# Patient Record
Sex: Male | Born: 1980 | Race: Black or African American | Hispanic: No | Marital: Married | Smoking: Never smoker
Health system: Southern US, Community
[De-identification: ages and names within clinical notes are randomized; demographics above are authoritative.]

## PROBLEM LIST (undated history)

## (undated) DIAGNOSIS — I10 Essential (primary) hypertension: Secondary | ICD-10-CM

## (undated) HISTORY — PX: SINUS SURGERY WITH INSTATRAK: SHX5215

## (undated) HISTORY — PX: TONSILLECTOMY: SUR1361

---

## 2017-01-09 ENCOUNTER — Emergency Department (HOSPITAL_COMMUNITY): Payer: 59

## 2017-01-09 ENCOUNTER — Other Ambulatory Visit: Payer: Self-pay

## 2017-01-09 ENCOUNTER — Encounter (HOSPITAL_BASED_OUTPATIENT_CLINIC_OR_DEPARTMENT_OTHER): Payer: Self-pay | Admitting: *Deleted

## 2017-01-09 ENCOUNTER — Inpatient Hospital Stay (HOSPITAL_BASED_OUTPATIENT_CLINIC_OR_DEPARTMENT_OTHER)
Admission: EM | Admit: 2017-01-09 | Discharge: 2017-01-13 | DRG: 395 | Disposition: A | Discharge status: Home / Self Care | Payer: 59 | Attending: Internal Medicine | Admitting: Internal Medicine

## 2017-01-09 DIAGNOSIS — X58XXXA Exposure to other specified factors, initial encounter: Secondary | ICD-10-CM | POA: Diagnosis present

## 2017-01-09 DIAGNOSIS — I10 Essential (primary) hypertension: Secondary | ICD-10-CM | POA: Diagnosis present

## 2017-01-09 DIAGNOSIS — T183XXA Foreign body in small intestine, initial encounter: Principal | ICD-10-CM | POA: Diagnosis present

## 2017-01-09 DIAGNOSIS — T184XXA Foreign body in colon, initial encounter: Secondary | ICD-10-CM | POA: Diagnosis not present

## 2017-01-09 DIAGNOSIS — Z79899 Other long term (current) drug therapy: Secondary | ICD-10-CM

## 2017-01-09 DIAGNOSIS — T447X5A Adverse effect of beta-adrenoreceptor antagonists, initial encounter: Secondary | ICD-10-CM | POA: Diagnosis present

## 2017-01-09 DIAGNOSIS — E876 Hypokalemia: Secondary | ICD-10-CM | POA: Diagnosis present

## 2017-01-09 DIAGNOSIS — R1031 Right lower quadrant pain: Secondary | ICD-10-CM | POA: Diagnosis present

## 2017-01-09 DIAGNOSIS — D509 Iron deficiency anemia, unspecified: Secondary | ICD-10-CM | POA: Diagnosis present

## 2017-01-09 DIAGNOSIS — R001 Bradycardia, unspecified: Secondary | ICD-10-CM | POA: Diagnosis present

## 2017-01-09 DIAGNOSIS — R1084 Generalized abdominal pain: Secondary | ICD-10-CM

## 2017-01-09 DIAGNOSIS — E739 Lactose intolerance, unspecified: Secondary | ICD-10-CM | POA: Diagnosis present

## 2017-01-09 HISTORY — DX: Essential (primary) hypertension: I10

## 2017-01-09 LAB — LIPASE, BLOOD: Lipase: 29 U/L (ref 11–51)

## 2017-01-09 LAB — CBC WITH DIFFERENTIAL/PLATELET
Basophils Absolute: 0 10*3/uL (ref 0.0–0.1)
Basophils Relative: 0 %
Eosinophils Absolute: 0.2 10*3/uL (ref 0.0–0.7)
Eosinophils Relative: 2 %
HCT: 38.1 % — ABNORMAL LOW (ref 39.0–52.0)
Hemoglobin: 12.4 g/dL — ABNORMAL LOW (ref 13.0–17.0)
Lymphocytes Relative: 28 %
Lymphs Abs: 2.3 10*3/uL (ref 0.7–4.0)
MCH: 21.6 pg — ABNORMAL LOW (ref 26.0–34.0)
MCHC: 32.5 g/dL (ref 30.0–36.0)
MCV: 66.3 fL — ABNORMAL LOW (ref 78.0–100.0)
Monocytes Absolute: 0.9 10*3/uL (ref 0.1–1.0)
Monocytes Relative: 11 %
Neutro Abs: 4.8 10*3/uL (ref 1.7–7.7)
Neutrophils Relative %: 59 %
Platelets: 187 10*3/uL (ref 150–400)
RBC: 5.75 MIL/uL (ref 4.22–5.81)
RDW: 14.8 % (ref 11.5–15.5)
WBC: 8.2 10*3/uL (ref 4.0–10.5)

## 2017-01-09 LAB — URINALYSIS, MICROSCOPIC (REFLEX)

## 2017-01-09 LAB — COMPREHENSIVE METABOLIC PANEL
ALT: 20 U/L (ref 17–63)
AST: 19 U/L (ref 15–41)
Albumin: 4.2 g/dL (ref 3.5–5.0)
Alkaline Phosphatase: 88 U/L (ref 38–126)
Anion gap: 9 (ref 5–15)
BUN: 14 mg/dL (ref 6–20)
CO2: 29 mmol/L (ref 22–32)
Calcium: 9.1 mg/dL (ref 8.9–10.3)
Chloride: 102 mmol/L (ref 101–111)
Creatinine, Ser: 1.08 mg/dL (ref 0.61–1.24)
GFR calc Af Amer: >60 mL/min (ref >60)
GFR calc non Af Amer: >60 mL/min (ref >60)
Glucose, Bld: 91 mg/dL (ref 65–99)
Potassium: 3.4 mmol/L — ABNORMAL LOW (ref 3.5–5.1)
Sodium: 140 mmol/L (ref 135–145)
Total Bilirubin: 1.3 mg/dL — ABNORMAL HIGH (ref 0.3–1.2)
Total Protein: 7.4 g/dL (ref 6.5–8.1)

## 2017-01-09 LAB — URINALYSIS, ROUTINE W REFLEX MICROSCOPIC
Bilirubin Urine: NEGATIVE
Glucose, UA: NEGATIVE mg/dL
Hgb urine dipstick: NEGATIVE
Ketones, ur: 15 mg/dL — AB
Leukocytes, UA: NEGATIVE
Nitrite: NEGATIVE
Protein, ur: 30 mg/dL — AB
Specific Gravity, Urine: 1.01 (ref 1.005–1.030)
pH: 6.5 (ref 5.0–8.0)

## 2017-01-09 MED ORDER — PANTOPRAZOLE SODIUM 40 MG PO TBEC
40.0000 mg | DELAYED_RELEASE_TABLET | Freq: Every day | ORAL | Status: DC
  Administered 2017-01-09 – 2017-01-13 (×5): 40 mg via ORAL
  Filled 2017-01-09 (×5): qty 1

## 2017-01-09 MED ORDER — MORPHINE SULFATE (PF) 2 MG/ML IV SOLN
2.0000 mg | INTRAVENOUS | Status: DC | PRN
Start: 2017-01-09 — End: 2017-01-09
  Administered 2017-01-09: 2 mg via INTRAVENOUS

## 2017-01-09 MED ORDER — MORPHINE SULFATE (PF) 2 MG/ML IV SOLN
INTRAVENOUS | Status: AC
  Administered 2017-01-09: 2 mg via INTRAVENOUS
  Filled 2017-01-09: qty 1

## 2017-01-09 MED ORDER — LABETALOL HCL 5 MG/ML IV SOLN: 5.0000 mg | INTRAVENOUS | Status: DC | PRN

## 2017-01-09 MED ORDER — ACETAMINOPHEN 325 MG PO TABS
650.0000 mg | ORAL_TABLET | Freq: Four times a day (QID) | ORAL | Status: DC | PRN
  Administered 2017-01-09: 650 mg via ORAL
  Filled 2017-01-09: qty 2

## 2017-01-09 MED ORDER — ONDANSETRON HCL 4 MG/2ML IJ SOLN
4.0000 mg | Freq: Four times a day (QID) | INTRAMUSCULAR | Status: DC | PRN
  Administered 2017-01-10: 4 mg via INTRAVENOUS
  Filled 2017-01-09: qty 2

## 2017-01-09 MED ORDER — POTASSIUM CHLORIDE CRYS ER 20 MEQ PO TBCR
20.0000 meq | EXTENDED_RELEASE_TABLET | Freq: Once | ORAL | Status: AC
  Administered 2017-01-09: 20 meq via ORAL
  Filled 2017-01-09: qty 1

## 2017-01-09 MED ORDER — POTASSIUM CHLORIDE CRYS ER 20 MEQ PO TBCR: 20.0000 meq | EXTENDED_RELEASE_TABLET | Freq: Every day | ORAL | Status: DC

## 2017-01-09 MED ORDER — IOPAMIDOL (ISOVUE-300) INJECTION 61%
INTRAVENOUS | Status: AC
  Administered 2017-01-09: 100 mL via INTRAVENOUS
  Filled 2017-01-09: qty 100

## 2017-01-09 MED ORDER — METOPROLOL TARTRATE 25 MG PO TABS
100.0000 mg | ORAL_TABLET | Freq: Two times a day (BID) | ORAL | Status: DC
  Administered 2017-01-09 – 2017-01-11 (×4): 100 mg via ORAL
  Filled 2017-01-09 (×6): qty 4

## 2017-01-09 MED ORDER — SODIUM CHLORIDE 0.9 % IV SOLN
INTRAVENOUS | Status: DC
  Administered 2017-01-09 – 2017-01-13 (×5): via INTRAVENOUS

## 2017-01-09 MED ORDER — AMLODIPINE BESYLATE 5 MG PO TABS
10.0000 mg | ORAL_TABLET | Freq: Every day | ORAL | Status: DC
  Administered 2017-01-09 – 2017-01-13 (×5): 10 mg via ORAL
  Filled 2017-01-09 (×5): qty 2

## 2017-01-09 MED ORDER — ONDANSETRON HCL 4 MG PO TABS: 4.0000 mg | ORAL_TABLET | Freq: Four times a day (QID) | ORAL | Status: DC | PRN

## 2017-01-09 MED ORDER — MORPHINE SULFATE (PF) 4 MG/ML IV SOLN
2.0000 mg | INTRAVENOUS | Status: DC | PRN
  Administered 2017-01-10: 4 mg via INTRAVENOUS
  Administered 2017-01-10: 2 mg via INTRAVENOUS
  Administered 2017-01-10: 4 mg via INTRAVENOUS
  Administered 2017-01-10: 2 mg via INTRAVENOUS
  Administered 2017-01-11 (×3): 4 mg via INTRAVENOUS
  Filled 2017-01-09 (×7): qty 1

## 2017-01-09 MED ORDER — ACETAMINOPHEN 650 MG RE SUPP: 650.0000 mg | Freq: Four times a day (QID) | RECTAL | Status: DC | PRN

## 2017-01-09 NOTE — ED Notes (Signed)
Bed: [id number] Expected date:  Expected time:  Means of arrival:  Comments: Dail

## 2017-01-09 NOTE — ED Provider Notes (Signed)
[facility] EMERGENCY DEPARTMENT Provider Note   CSN: [id number] Arrival date & time: 01/09/17  1242     History   Chief Complaint Chief Complaint  Patient presents with  . Abdominal Pain    HPI Joel Perkins is a 36 y.o. male.  HPI   Pain for 2 days across abdomen, epigastric down to the right lower side.  Is constant pain. Trying to move or squat down or turn it is worse. Son had similar symptoms 3 days ago, aws given miralax, but he has still had pain.  Reports he is not constipated, tried miralax, milk which did not help pain.  Has had 4 BM. No nausea or vomiting. Had fever last night thinks, was sweating and felt hot.  Lower appetite.  Past Medical History:  Diagnosis Date  . Hypertension     There are no active problems to display for this patient.   Past Surgical History:  Procedure Laterality Date  . SINUS SURGERY WITH INSTATRAK    . TONSILLECTOMY         Home Medications    Prior to Admission medications   Medication Sig Start Date End Date Taking? Authorizing Provider  AMLODIPINE BESYLATE PO Take by mouth.   Yes [provider]  LISINOPRIL PO Take by mouth.   Yes [provider]  METOPROLOL SUCCINATE PO Take by mouth.   Yes [provider]  Potassium (POTASSIMIN PO) Take by mouth.   Yes [provider]    Family History No family history on file.  Social History Social History   Tobacco Use  . Smoking status: Never Smoker  . Smokeless tobacco: Never Used  Substance Use Topics  . Alcohol use: Yes    Frequency: Never  . Drug use: No     Allergies   Patient has no known allergies.   Review of Systems Review of Systems  Constitutional: Positive for appetite change and fever (subjective).  HENT: Negative for sore throat.   Eyes: Negative for visual disturbance.  Respiratory: Negative for shortness of breath.   Cardiovascular: Negative for chest pain.  Gastrointestinal: Positive for  abdominal pain. Negative for constipation, diarrhea, nausea and vomiting.  Genitourinary: Negative for difficulty urinating and dysuria (no dysuria but does reoprt pain in abdomen with urination).  Musculoskeletal: Negative for back pain and neck stiffness.  Skin: Negative for rash.  Neurological: Negative for syncope and headaches.     Physical Exam Updated Vital Signs BP (!) 160/111 (BP Location: Right Arm)   Pulse 70   Temp 98.9 F (37.2 C) (Oral)   Resp 18   Ht 5\' 8"  (1.727 m)   Wt 90.7 kg (200 lb)   SpO2 100%   BMI 30.41 kg/m   Physical Exam  Constitutional: He is oriented to person, place, and time. He appears well-developed and well-nourished. No distress.  HENT:  Head: Normocephalic and atraumatic.  Eyes: Conjunctivae and EOM are normal.  Neck: Normal range of motion.  Cardiovascular: Normal rate, regular rhythm, normal heart sounds and intact distal pulses. Exam reveals no gallop and no friction rub.  No murmur heard. Pulmonary/Chest: Effort normal and breath sounds normal. No respiratory distress. He has no wheezes. He has no rales.  Abdominal: Soft. He exhibits no distension. There is tenderness in the right lower quadrant. There is tenderness at McBurney's point. There is no guarding.  Musculoskeletal: He exhibits no edema.  Neurological: He is alert and oriented to person, place, and time.  Skin: Skin is  warm and dry. He is not diaphoretic.  Nursing note and vitals reviewed.    ED Treatments / Results  Labs (all labs ordered are listed, but only abnormal results are displayed) Labs Reviewed  URINALYSIS, ROUTINE W REFLEX MICROSCOPIC - Abnormal; Notable for the following components:      Result Value   Ketones, ur 15 (*)    Protein, ur 30 (*)    All other components within normal limits  URINALYSIS, MICROSCOPIC (REFLEX) - Abnormal; Notable for the following components:   Bacteria, UA FEW (*)    Squamous Epithelial / LPF 0-5 (*)    All other components  within normal limits  COMPREHENSIVE METABOLIC PANEL - Abnormal; Notable for the following components:   Potassium 3.4 (*)    Total Bilirubin 1.3 (*)    All other components within normal limits  CBC WITH DIFFERENTIAL/PLATELET - Abnormal; Notable for the following components:   Hemoglobin 12.4 (*)    HCT 38.1 (*)    MCV 66.3 (*)    MCH 21.6 (*)    All other components within normal limits  LIPASE, BLOOD    EKG  EKG Interpretation None       Radiology No results found.  Procedures Procedures (including critical care time)  Medications Ordered in ED Medications - No data to display   Initial Impression / Assessment and Plan / ED Course  I have reviewed the triage vital signs and the nursing notes.  Pertinent labs & imaging results that were available during my care of the patient were reviewed by me and considered in my medical decision making (see chart for details).    36yo male with htn presents with concern for RLQ abdominal pain.  History, exam, labs not consistent with SBO, cholecystitis or UTI.  Concern for possible appendicitis given RLQ tenderness, decreased appetite.  [facility] Discussed with Dr. Denton LankSteinl. [facility]pelvis CT.   If CT negative, consider possible muscular strain, however at this time given combination of symptoms and exam requires CT for appendicitis r/o.     Final Clinical Impressions(s) / ED Diagnoses   Final diagnoses:  Right lower quadrant pain    ED Discharge Orders    None       Alvira MondaySchlossman, Tonesha Tsou, MD 01/09/17 [phone]

## 2017-01-09 NOTE — ED Notes (Signed)
Please call Lelon MastSamantha for report at 2115. Number [phone]

## 2017-01-09 NOTE — H&P (Signed)
History and Physical    Joel LenzObinna Chapa Perkins DOB: 03/19/1980 DOA: 01/09/2017  PCP: Patient, No Pcp Per  Patient coming from: Home  I have personally briefly [facility]  Chief Complaint: abd pain  HPI: Joel Perkins is a 36 y.o. male with medical history significant of HTN.  Patient presents to ED with c/o 2 day history of RLQ abd pain.  Radiates up his R side.  Worse with movement.  Not constipated.  Has had 4 BMs.  No n/v.  Thinks he had fevers last night.   ED Course: CT reveals that patient has a foreign body, likely a small bone in terminal ileum. (patient admits he eats chicken bones and fish bones routinely but never had these get stuck before).   Review of Systems: As per HPI otherwise 10 point review of systems negative.   Past Medical History:  Diagnosis Date  . Hypertension     Past Surgical History:  Procedure Laterality Date  . SINUS SURGERY WITH INSTATRAK    . TONSILLECTOMY       reports that  has never smoked. he has never used smokeless tobacco. He reports that he drinks alcohol. He reports that he does not use drugs.  Allergies  Allergen Reactions  . Lactose Nausea And Vomiting    No family history on file. Son had abdominal pain 3 days ago.  Prior to Admission medications   Medication Sig Start Date End Date Taking? Authorizing Provider  amLODipine (NORVASC) 10 MG tablet Take 10 mg by mouth daily. 11/14/16  Yes [provider]  lisinopril-hydrochlorothiazide (PRINZIDE,ZESTORETIC) 20-12.5 MG tablet Take 1 tablet by mouth 2 (two) times daily. 11/14/16  Yes [provider]  metoprolol tartrate (LOPRESSOR) 100 MG tablet Take 100 mg by mouth 2 (two) times daily. 11/14/16  Yes [provider]  pantoprazole (PROTONIX) 40 MG tablet Take 40 mg by mouth daily. 10/22/16  Yes [provider]  potassium chloride SA (K-DUR,KLOR-CON) 20 MEQ tablet Take 20 mEq by mouth daily. 11/14/16   Yes [provider]    Physical Exam: Vitals:   01/09/17 1254 01/09/17 1258 01/09/17 1529  BP:  (!) 164/105 (!) 160/111  Pulse:  67 70  Resp:  18 18  Temp:  98.9 F (37.2 C)   TempSrc:  Oral   SpO2:  98% 100%  Weight: 90.7 kg (200 lb)    Height: 5\' 8"  (1.727 m)      Constitutional: NAD, calm, comfortable Eyes: PERRL, lids and conjunctivae normal ENMT: Mucous membranes are moist. Posterior pharynx clear of any exudate or lesions.Normal dentition.  Neck: normal, supple, no masses, no thyromegaly Respiratory: clear to auscultation bilaterally, no wheezing, no crackles. Normal respiratory effort. No accessory muscle use.  Cardiovascular: Regular rate and rhythm, no murmurs / rubs / gallops. No extremity edema. 2+ pedal pulses. No carotid bruits.  Abdomen: RLQ tenderness Musculoskeletal: no clubbing / cyanosis. No joint deformity upper and lower extremities. Good ROM, no contractures. Normal muscle tone.  Skin: no rashes, lesions, ulcers. No induration Neurologic: CN 2-12 grossly intact. Sensation intact, DTR normal. Strength 5/5 in all 4.  Psychiatric: Normal judgment and insight. Alert and oriented x 3. Normal mood.    Labs on Admission: I have personally reviewed following labs and imaging studies  CBC: Recent Labs  Lab 01/09/17 1507  WBC 8.2  NEUTROABS 4.8  HGB 12.4*  HCT 38.1*  MCV 66.3*  PLT 187   Basic Metabolic Panel: Recent  Labs  Lab 01/09/17 1507  NA 140  K 3.4*  CL 102  CO2 29  GLUCOSE 91  BUN 14  CREATININE 1.08  CALCIUM 9.1   GFR: Estimated Creatinine Clearance: 103.4 mL/min (by C-G formula based on SCr of 1.08 mg/dL). Liver Function Tests: Recent Labs  Lab 01/09/17 1507  AST 19  ALT 20  ALKPHOS 88  BILITOT 1.3*  PROT 7.4  ALBUMIN 4.2   Recent Labs  Lab 01/09/17 1507  LIPASE 29   No results for input(s): AMMONIA in the last 168 hours. Coagulation Profile: No results for input(s): INR, PROTIME in the last 168 hours. Cardiac  Enzymes: No results for input(s): CKTOTAL, CKMB, CKMBINDEX, TROPONINI in the last 168 hours. BNP (last 3 results) No results for input(s): PROBNP in the last 8760 hours. HbA1C: No results for input(s): HGBA1C in the last 72 hours. CBG: No results for input(s): GLUCAP in the last 168 hours. Lipid Profile: No results for input(s): CHOL, HDL, LDLCALC, TRIG, CHOLHDL, LDLDIRECT in the last 72 hours. Thyroid Function Tests: No results for input(s): TSH, T4TOTAL, FREET4, T3FREE, THYROIDAB in the last 72 hours. Anemia Panel: No results for input(s): VITAMINB12, FOLATE, FERRITIN, TIBC, IRON, RETICCTPCT in the last 72 hours. Urine analysis:    Component Value Date/Time   COLORURINE YELLOW 01/09/2017 1300   APPEARANCEUR CLEAR 01/09/2017 1300   LABSPEC 1.010 01/09/2017 1300   PHURINE 6.5 01/09/2017 1300   GLUCOSEU NEGATIVE 01/09/2017 1300   HGBUR NEGATIVE 01/09/2017 1300   BILIRUBINUR NEGATIVE 01/09/2017 1300   KETONESUR 15 (A) 01/09/2017 1300   PROTEINUR 30 (A) 01/09/2017 1300   NITRITE NEGATIVE 01/09/2017 1300   LEUKOCYTESUR NEGATIVE 01/09/2017 1300    Radiological Exams on Admission: Ct Abdomen Pelvis W Contrast  Result Date: 01/09/2017 CLINICAL DATA:  Right lower quadrant abdominal pain for 2 days. EXAM: CT ABDOMEN AND PELVIS WITH CONTRAST TECHNIQUE: Multidetector CT imaging of the abdomen and pelvis was performed using the standard protocol following bolus administration of intravenous contrast. CONTRAST:  100mL ISOVUE-300 IOPAMIDOL (ISOVUE-300) INJECTION 61% COMPARISON:  None. FINDINGS: Lower chest: The lung bases are clear of acute process. No pleural effusion or pulmonary lesions. The heart is normal in size. No pericardial effusion. The distal esophagus and aorta are unremarkable. Hepatobiliary: No focal hepatic lesions or intrahepatic biliary dilatation. The gallbladder is normal. No common bile duct dilatation. Pancreas: No mass, inflammation or ductal dilatation. Spleen: Normal  size.  No focal lesions. Adrenals/Urinary Tract: No renal lesions or hydronephrosis. The bladder appears normal. Stomach/Bowel: The stomach, duodenum, small bowel and colon are unremarkable except for the distal ileum which contains a foreign body the which is most likely a small bone possibly a chicken or fishbone. It measures approximately 23 mm. There is mucosal enhancement, submucosal edema and mild serosal enhancement. I do not see any evidence for perforation but I suspect the foreign body may be projecting through the mucosa and into the wall of the ileum and could be stuck there. I would estimate that this foreign body is approximately 7 cm from the terminal ileum and it may be retrievable via colonoscopy. No findings to suggest a small bowel obstruction. The colon is unremarkable. The appendix is normal. Vascular/Lymphatic: The aorta is normal in caliber. No dissection. The branch vessels are patent. The major venous structures are patent. No mesenteric or retroperitoneal mass or adenopathy. Small scattered lymph nodes are noted. Reproductive: The prostate gland and seminal vesicles are unremarkable. Other: No pelvic mass or adenopathy. No free pelvic  fluid collections. No inguinal mass or adenopathy. Moderate size periumbilical abdominal wall hernia containing fat. Musculoskeletal: No significant bony findings. IMPRESSION: 1. 23 mm thin foreign body in the distal ileum is most likely a small bone of some type. Moderate inflammation of the bowel in this area and I would be worried that the foreign body is stuck, possibly piercing the mucosa. No findings for perforation or small bowel obstruction. It may be accessible via colonoscopy. 2. Normal appendix. 3. Periumbilical abdominal wall hernia containing fat. Electronically Signed   By: Rudie Meyer M.D.   On: 01/09/2017 18:52    EKG: Independently reviewed.  Assessment/Plan Principal Problem:   Foreign body in small intestine Active Problems:   HTN  (hypertension)    1. Foreign body in small intestine - terminal ileum 1. Dr. Bosie Clos called by EDP 1. Plan to see tomorrow 2. Do bowel prep starting tomorrow 3. Colonoscopy if foreign body still present on Sunday and doesn't come out with bowel prep. 2. No evidence of perf, if develops, then call gen surg. 3. No SIRS, monitor for evidence of SIRS, if develops then start ABx. 4. Discussed, recommendation not to eat bones in future. 5. Will keep NPO except for sips with meds (assuming that this much is OKAY since GI is planning on bowel prep tomorrow) 6. Will put on NS at 100 cc/hr to prevent dehydration 2. HTN - 1. Continue amlodipine 2. Continue metoprolol 3. Holding lisinopril-HCTZ for now 3. Hypokalemia - K of 3.4 1. Replace 2. BMP in AM  DVT prophylaxis: SCDs Code Status: Full Family Communication: Family at bedside Disposition Plan: Home after admit Consults called: Dr. Bosie Clos Admission status: Admit to inpatient   Hillary Bow. DO [facility] Pager [phone]  If 7AM-7PM, please contact day team taking care of patient [email or web address] Password [facility]  01/09/2017, 9:03 PM

## 2017-01-09 NOTE — ED Triage Notes (Signed)
Abdominal pain x 2 days. Sharp generalized pain. Headache. Chills.

## 2017-01-09 NOTE — ED Notes (Addendum)
[facility]  Dr. Denton LankSteinl accepting.  Report called to CN -Matt.  IV secured for transfer

## 2017-01-09 NOTE — ED Provider Notes (Signed)
36 year old male presents today with abdominal pain.  Patient was seen at a satellite facility where their CT scanner was down, patient was transferred here for CT evaluation of abdominal pain.  Please see previous providers note for full H&P.  Patient with 3 days of abdominal pain from the epigastric region down the right lower side.  Patient notes that the pain is persistent, worse with movement.  He reports no nausea or vomiting, reports feeling hot last night with no objective fever.  Patient reports he has not had anything to eat or drink since last night.  Reports normal bowel movements.  CT scan performed here shows 23 mm thin foreign body in the distal ileum with moderate surrounding inflammation of the bowel.  Spoke with Dr. Bosie ClosSchooler on the phone who recommended patient be admitted to the hospitalist service, he will evaluate the patient tomorrow for bowel prep and potential intervention the following day if this does not pass.  Patient is well-appearing at this time, hospitalist service consulted for admission who agreed to admit.  Vitals:   01/09/17 1529 01/09/17 2134  BP: (!) 160/111 (!) 174/101  Pulse: 70 66  Resp: 18 18  Temp:  98 F (36.7 C)  SpO2: 100% 98%       Eyvonne MechanicHedges, Jamilah Jean, PA-C 01/09/17 2324    Rolan BuccoBelfi, Melanie, MD 01/09/17 2342

## 2017-01-10 LAB — BASIC METABOLIC PANEL
Anion gap: 5 (ref 5–15)
BUN: 13 mg/dL (ref 6–20)
CO2: 29 mmol/L (ref 22–32)
Calcium: 8.8 mg/dL — ABNORMAL LOW (ref 8.9–10.3)
Chloride: 105 mmol/L (ref 101–111)
Creatinine, Ser: 0.91 mg/dL (ref 0.61–1.24)
GFR calc Af Amer: >60 mL/min (ref >60)
GFR calc non Af Amer: >60 mL/min (ref >60)
Glucose, Bld: 87 mg/dL (ref 65–99)
Potassium: 3.1 mmol/L — ABNORMAL LOW (ref 3.5–5.1)
Sodium: 139 mmol/L (ref 135–145)

## 2017-01-10 LAB — CBC
HCT: 38.3 % — ABNORMAL LOW (ref 39.0–52.0)
Hemoglobin: 12.3 g/dL — ABNORMAL LOW (ref 13.0–17.0)
MCH: 21.9 pg — ABNORMAL LOW (ref 26.0–34.0)
MCHC: 32.1 g/dL (ref 30.0–36.0)
MCV: 68.3 fL — ABNORMAL LOW (ref 78.0–100.0)
Platelets: 170 10*3/uL (ref 150–400)
RBC: 5.61 MIL/uL (ref 4.22–5.81)
RDW: 14.5 % (ref 11.5–15.5)
WBC: 7.3 10*3/uL (ref 4.0–10.5)

## 2017-01-10 LAB — HIV ANTIBODY (ROUTINE TESTING W REFLEX): HIV Screen 4th Generation wRfx: NONREACTIVE

## 2017-01-10 LAB — IRON AND TIBC
Iron: 30 ug/dL — ABNORMAL LOW (ref 45–182)
Saturation Ratios: 9 % — ABNORMAL LOW (ref 17.9–39.5)
TIBC: 330 ug/dL (ref 250–450)
UIBC: 300 ug/dL

## 2017-01-10 LAB — FERRITIN: Ferritin: 126 ng/mL (ref 24–336)

## 2017-01-10 MED ORDER — POTASSIUM CHLORIDE CRYS ER 20 MEQ PO TBCR
40.0000 meq | EXTENDED_RELEASE_TABLET | ORAL | Status: AC
  Administered 2017-01-10 (×2): 40 meq via ORAL
  Filled 2017-01-10 (×2): qty 2

## 2017-01-10 MED ORDER — SALINE SPRAY 0.65 % NA SOLN
1.0000 | NASAL | Status: DC | PRN
  Administered 2017-01-10: 1 via NASAL
  Filled 2017-01-10: qty 44

## 2017-01-10 MED ORDER — PEG 3350-KCL-NA BICARB-NACL 420 G PO SOLR
4000.0000 mL | Freq: Once | ORAL | Status: AC
  Administered 2017-01-10: 4000 mL via ORAL

## 2017-01-10 NOTE — Consult Note (Signed)
Referring Provider: Dr. Alvino Chapel Primary Care Physician:  Patient, No Pcp Per Primary Gastroenterologist:  Gentry Fitz  Reason for Consultation:  Abdominal pain; Foreign body in gut  HPI: Joel Perkins is a 36 y.o. male with 2 days of RLQ pain and lack of appetite. 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 CT shows a small bone in the distal ileum that measures 2.3 cm with surrounding mucosal and submucosal edema. No perforation is seen. The bone is thought to be "7 cm from the terminal ileum." He does not recall swallowing a bone but eats fried chicken and fish regularly. Denies melena, hematochezia. Reports having a BM 2 days ago but pain continued. He took Miralax recently but does not feel that it helped. Complaining of ongoing abdominal pain. Has never had a colonoscopy.  Past Medical History:  Diagnosis Date  . Hypertension     Past Surgical History:  Procedure Laterality Date  . SINUS SURGERY WITH INSTATRAK    . TONSILLECTOMY      Prior to Admission medications   Medication Sig Start Date End Date Taking? Authorizing Provider  amLODipine (NORVASC) 10 MG tablet Take 10 mg by mouth daily. 11/14/16  Yes [provider]  lisinopril-hydrochlorothiazide (PRINZIDE,ZESTORETIC) 20-12.5 MG tablet Take 1 tablet by mouth 2 (two) times daily. 11/14/16  Yes [provider]  metoprolol tartrate (LOPRESSOR) 100 MG tablet Take 100 mg by mouth 2 (two) times daily. 11/14/16  Yes [provider]  pantoprazole (PROTONIX) 40 MG tablet Take 40 mg by mouth daily. 10/22/16  Yes [provider]  potassium chloride SA (K-DUR,KLOR-CON) 20 MEQ tablet Take 20 mEq by mouth daily. 11/14/16  Yes [provider]    Scheduled Meds: . amLODipine  10 mg Oral Daily  . metoprolol tartrate  100 mg Oral BID  . pantoprazole  40 mg Oral Daily  . polyethylene glycol-electrolytes  4,000 mL Oral Once  . potassium chloride  40 mEq Oral Q4H    Continuous Infusions: . sodium chloride 100 mL/hr at 01/09/17 2156   PRN Meds:.acetaminophen **OR** acetaminophen, morphine injection, ondansetron **OR** ondansetron (ZOFRAN) IV  Allergies as of 01/09/2017 - Review Complete 01/09/2017  Allergen Reaction Noted  . Lactose Nausea And Vomiting 02/17/2013  . Other  01/09/2017    No family history on file.  Social History   Socioeconomic History  . Marital status: Legally Separated    Spouse name: Not on file  . Number of children: Not on file  . Years of education: Not on file  . Highest education level: Not on file  Social Needs  . Financial resource strain: Not on file  . Food insecurity - worry: Not on file  . Food insecurity - inability: Not on file  . Transportation needs - medical: Not on file  . Transportation needs - non-medical: Not on file  Occupational History  . Not on file  Tobacco Use  . Smoking status: Never Smoker  . Smokeless tobacco: Never Used  Substance and Sexual Activity  . Alcohol use: Yes    Frequency: Never  . Drug use: No  . Sexual activity: Not on file  Other Topics Concern  . Not on file  Social History Narrative  . Not on file    Review of Systems: All negative except as stated above in HPI.  Physical Exam: Vital signs: Vitals:   01/10/17 0428 01/10/17 0918  BP: (!) 154/98 (!) 155/85  Pulse: 70 (!) 52  Resp: 18   Temp: 98.1 F (36.7 C)   SpO2: 99%    Last BM Date: 01/09/17 General:   Alert,  Well-developed, well-nourished, pleasant and cooperative in NAD Head: normocephalic, atraumatic Eyes: anicteric sclera ENT: oropharynx clear Neck: supple, nontender Lungs:  Clear throughout to auscultation.   No wheezes, crackles, or rhonchi. No acute distress. Heart:  Regular rate and rhythm; no murmurs, clicks, rubs,  or gallops. Abdomen: diffuse tenderness (greatest in RLQ) with guarding, mild distention, soft, +BS  Rectal:  Deferred Ext: no edema  GI:  Lab Results: Recent Labs     01/09/17 1507 01/10/17 0436  WBC 8.2 7.3  HGB 12.4* 12.3*  HCT 38.1* 38.3*  PLT 187 170   BMET Recent Labs    01/09/17 1507 01/10/17 0436  NA 140 139  K 3.4* 3.1*  CL 102 105  CO2 29 29  GLUCOSE 91 87  BUN 14 13  CREATININE 1.08 0.91  CALCIUM 9.1 8.8*   LFT Recent Labs    01/09/17 1507  PROT 7.4  ALBUMIN 4.2  AST 19  ALT 20  ALKPHOS 88  BILITOT 1.3*   PT/INR No results for input(s): LABPROT, INR in the last 72 hours.   Studies/Results: Ct Abdomen Pelvis W Contrast  Result Date: 01/09/2017 CLINICAL DATA:  Right lower quadrant abdominal pain for 2 days. EXAM: CT ABDOMEN AND PELVIS WITH CONTRAST TECHNIQUE: Multidetector CT imaging of the abdomen and pelvis was performed using the standard protocol following bolus administration of intravenous contrast. CONTRAST:  100mL ISOVUE-300 IOPAMIDOL (ISOVUE-300) INJECTION 61% COMPARISON:  None. FINDINGS: Lower chest: The lung bases are clear of acute process. No pleural effusion or pulmonary lesions. The heart is normal in size. No pericardial effusion. The distal esophagus and aorta are unremarkable. Hepatobiliary: No focal hepatic lesions or intrahepatic biliary dilatation. The gallbladder is normal. No common bile duct dilatation. Pancreas: No mass, inflammation or ductal dilatation. Spleen: Normal size.  No focal lesions. Adrenals/Urinary Tract: No renal lesions or hydronephrosis. The bladder appears normal. Stomach/Bowel: The stomach, duodenum, small bowel and colon are unremarkable except for the distal ileum which contains a foreign body the which is most likely a small bone possibly a chicken or fishbone. It measures approximately 23 mm. There is mucosal enhancement, submucosal edema and mild serosal enhancement. I do not see any evidence for perforation but I suspect the foreign body may be projecting through the mucosa and into the wall of the ileum and could be stuck there. I would estimate that this foreign body is  approximately 7 cm from the terminal ileum and it may be retrievable via colonoscopy. No findings to suggest a small bowel obstruction. The colon is unremarkable. The appendix is normal. Vascular/Lymphatic: The aorta is normal in caliber. No dissection. The branch vessels are patent. The major venous structures are patent. No mesenteric or retroperitoneal mass or adenopathy. Small scattered lymph nodes are noted. Reproductive: The prostate gland and seminal vesicles are unremarkable. Other: No pelvic mass or adenopathy. No free pelvic fluid collections. No inguinal mass or adenopathy. Moderate size periumbilical abdominal wall hernia containing fat. Musculoskeletal: No significant bony findings. IMPRESSION: 1. 23 mm thin foreign body in the distal ileum is most likely a small bone of some type. Moderate inflammation of the bowel in this area and I would be worried that the foreign body is stuck, possibly piercing the mucosa. No findings for perforation or small bowel obstruction. It may be accessible via colonoscopy. 2. Normal appendix. 3. Periumbilical abdominal  wall hernia containing fat. Electronically Signed   By: Rudie MeyerP.  Gallerani M.D.   On: 01/09/2017 18:52    Impression/Plan: Foreign body in distal ileum (possible bone) thought to be stuck in the wall causing RLQ pain. Will give a colon prep today and hope that the bone dislodges and advances into the colon. Check KUB tomorrow. If the bone does not advance out of the small bowel then will plan for a colonoscopy for 01/12/17 to try and retrieve the bone. I am not convinced it is far enough down the ileum to be reached with a colonoscope at this time and hopefully it will pass into the colon and then move out of the colon with normal colonic peristalsis. Clear liquid diet ok. Will follow.    LOS: 1 day   Myah Guynes C.  01/10/2017, 1:44 PM  Pager [phone]  AFTER 5 pm or on weekends please call [phone]

## 2017-01-10 NOTE — Progress Notes (Signed)
PROGRESS NOTE    Joel Perkins  VWU:[id number] DOB: 1980-05-10 DOA: 01/09/2017 PCP: Patient, No Pcp Per     Brief Narrative:  Joel Perkins is a 36 yo male with past medical history significant for HTN presented to the hospital with right lower quadrant abdominal pain.  Pain was severe and associated with subjective fevers.  In the emergency department, CT abdomen pelvis revealed a foreign body likely a small bone in the terminal ileum.  Patient does admit to frequent ingestion of chicken bone and fish bones.  GI was consulted.  Assessment & Plan:   Principal Problem:   Foreign body in small intestine Active Problems:   HTN (hypertension)   Foreign body in the small intestine, terminal ileum -GI consulted -Pain control -N.p.o. for now  Essential hypertension -Continue amlodipine, metoprolol  Hypokalemia -Replace, trend  Microcytic anemia -Microcytosis appears chronic, baseline Hgb 12-13  -Check iron studies   DVT prophylaxis: SCD Code Status: Full Family Communication: No family at bedside Disposition Plan: Pending improvement, GI consult   Consultants:   GI  Procedures:   None   Antimicrobials:  Anti-infectives (From admission, onward)   None       Subjective: Patient complains of right lower quadrant abdominal pain, well controlled with pain medication.  He had a normal bowel movement yesterday.  Has no other complaints, has been afebrile since admission, no nausea or vomiting today.  Objective: Vitals:   01/09/17 1529 01/09/17 2134 01/10/17 0428 01/10/17 0918  BP: (!) 160/111 (!) 174/101 (!) 154/98 (!) 155/85  Pulse: 70 66 70 (!) 52  Resp: 18 18 18    Temp:  98 F (36.7 C) 98.1 F (36.7 C)   TempSrc:  Oral Oral   SpO2: 100% 98% 99%   Weight:  92.9 kg (204 lb 11.2 oz)    Height:  5\' 8"  (1.727 m)      Intake/Output Summary (Last 24 hours) at 01/10/2017 1250 Last data filed at 01/10/2017 0600 Gross per 24 hour  Intake 806.67 ml  Output -  Net  806.67 ml   Filed Weights   01/09/17 1254 01/09/17 2134  Weight: 90.7 kg (200 lb) 92.9 kg (204 lb 11.2 oz)    Examination:  General exam: Appears calm and comfortable  Respiratory system: Clear to auscultation. Respiratory effort normal. Cardiovascular system: S1 & S2 heard, RRR. No JVD, murmurs, rubs, gallops or clicks. No pedal edema. Gastrointestinal system: Abdomen is nondistended, soft and TTP RLQ. No organomegaly or masses felt. Normal bowel sounds heard. Central nervous system: Alert and oriented. No focal neurological deficits. Extremities: Symmetric 5 x 5 power. Skin: No rashes, lesions or ulcers Psychiatry: Judgement and insight appear normal. Mood & affect appropriate.   Data Reviewed: I have personally reviewed following labs and imaging studies  CBC: Recent Labs  Lab 01/09/17 1507 01/10/17 0436  WBC 8.2 7.3  NEUTROABS 4.8  --   HGB 12.4* 12.3*  HCT 38.1* 38.3*  MCV 66.3* 68.3*  PLT 187 170   Basic Metabolic Panel: Recent Labs  Lab 01/09/17 1507 01/10/17 0436  NA 140 139  K 3.4* 3.1*  CL 102 105  CO2 29 29  GLUCOSE 91 87  BUN 14 13  CREATININE 1.08 0.91  CALCIUM 9.1 8.8*   GFR: Estimated Creatinine Clearance: 124.1 mL/min (by C-G formula based on SCr of 0.91 mg/dL). Liver Function Tests: Recent Labs  Lab 01/09/17 1507  AST 19  ALT 20  ALKPHOS 88  BILITOT 1.3*  PROT 7.4  ALBUMIN  4.2   Recent Labs  Lab 01/09/17 1507  LIPASE 29   No results for input(s): AMMONIA in the last 168 hours. Coagulation Profile: No results for input(s): INR, PROTIME in the last 168 hours. Cardiac Enzymes: No results for input(s): CKTOTAL, CKMB, CKMBINDEX, TROPONINI in the last 168 hours. BNP (last 3 results) No results for input(s): PROBNP in the last 8760 hours. HbA1C: No results for input(s): HGBA1C in the last 72 hours. CBG: No results for input(s): GLUCAP in the last 168 hours. Lipid Profile: No results for input(s): CHOL, HDL, LDLCALC, TRIG, CHOLHDL,  LDLDIRECT in the last 72 hours. Thyroid Function Tests: No results for input(s): TSH, T4TOTAL, FREET4, T3FREE, THYROIDAB in the last 72 hours. Anemia Panel: No results for input(s): VITAMINB12, FOLATE, FERRITIN, TIBC, IRON, RETICCTPCT in the last 72 hours. Sepsis Labs: No results for input(s): PROCALCITON, LATICACIDVEN in the last 168 hours.  No results found for this or any previous visit (from the past 240 hour(s)).     Radiology Studies: Ct Abdomen Pelvis W Contrast  Result Date: 01/09/2017 CLINICAL DATA:  Right lower quadrant abdominal pain for 2 days. EXAM: CT ABDOMEN AND PELVIS WITH CONTRAST TECHNIQUE: Multidetector CT imaging of the abdomen and pelvis was performed using the standard protocol following bolus administration of intravenous contrast. CONTRAST:  100mL ISOVUE-300 IOPAMIDOL (ISOVUE-300) INJECTION 61% COMPARISON:  None. FINDINGS: Lower chest: The lung bases are clear of acute process. No pleural effusion or pulmonary lesions. The heart is normal in size. No pericardial effusion. The distal esophagus and aorta are unremarkable. Hepatobiliary: No focal hepatic lesions or intrahepatic biliary dilatation. The gallbladder is normal. No common bile duct dilatation. Pancreas: No mass, inflammation or ductal dilatation. Spleen: Normal size.  No focal lesions. Adrenals/Urinary Tract: No renal lesions or hydronephrosis. The bladder appears normal. Stomach/Bowel: The stomach, duodenum, small bowel and colon are unremarkable except for the distal ileum which contains a foreign body the which is most likely a small bone possibly a chicken or fishbone. It measures approximately 23 mm. There is mucosal enhancement, submucosal edema and mild serosal enhancement. I do not see any evidence for perforation but I suspect the foreign body may be projecting through the mucosa and into the wall of the ileum and could be stuck there. I would estimate that this foreign body is approximately 7 cm from the  terminal ileum and it may be retrievable via colonoscopy. No findings to suggest a small bowel obstruction. The colon is unremarkable. The appendix is normal. Vascular/Lymphatic: The aorta is normal in caliber. No dissection. The branch vessels are patent. The major venous structures are patent. No mesenteric or retroperitoneal mass or adenopathy. Small scattered lymph nodes are noted. Reproductive: The prostate gland and seminal vesicles are unremarkable. Other: No pelvic mass or adenopathy. No free pelvic fluid collections. No inguinal mass or adenopathy. Moderate size periumbilical abdominal wall hernia containing fat. Musculoskeletal: No significant bony findings. IMPRESSION: 1. 23 mm thin foreign body in the distal ileum is most likely a small bone of some type. Moderate inflammation of the bowel in this area and I would be worried that the foreign body is stuck, possibly piercing the mucosa. No findings for perforation or small bowel obstruction. It may be accessible via colonoscopy. 2. Normal appendix. 3. Periumbilical abdominal wall hernia containing fat. Electronically Signed   By: Rudie MeyerP.  Gallerani M.D.   On: 01/09/2017 18:52      Scheduled Meds: . amLODipine  10 mg Oral Daily  . metoprolol tartrate  100  mg Oral BID  . pantoprazole  40 mg Oral Daily  . potassium chloride  40 mEq Oral Q4H   Continuous Infusions: . sodium chloride 100 mL/hr at 01/09/17 2156     LOS: 1 day    Time spent: 30 minutes   Noralee StainJennifer Aylssa Herrig, DO [facility] [email or web address] Password [email or web address] 01/10/2017, 12:50 PM

## 2017-01-11 ENCOUNTER — Inpatient Hospital Stay (HOSPITAL_COMMUNITY): Payer: 59

## 2017-01-11 LAB — BASIC METABOLIC PANEL
Anion gap: 6 (ref 5–15)
BUN: 9 mg/dL (ref 6–20)
CO2: 28 mmol/L (ref 22–32)
Calcium: 8.5 mg/dL — ABNORMAL LOW (ref 8.9–10.3)
Chloride: 106 mmol/L (ref 101–111)
Creatinine, Ser: 0.92 mg/dL (ref 0.61–1.24)
GFR calc Af Amer: >60 mL/min (ref >60)
GFR calc non Af Amer: >60 mL/min (ref >60)
Glucose, Bld: 91 mg/dL (ref 65–99)
Potassium: 3.5 mmol/L (ref 3.5–5.1)
Sodium: 140 mmol/L (ref 135–145)

## 2017-01-11 LAB — CBC
HCT: 37.3 % — ABNORMAL LOW (ref 39.0–52.0)
Hemoglobin: 12 g/dL — ABNORMAL LOW (ref 13.0–17.0)
MCH: 22.1 pg — ABNORMAL LOW (ref 26.0–34.0)
MCHC: 32.2 g/dL (ref 30.0–36.0)
MCV: 68.6 fL — ABNORMAL LOW (ref 78.0–100.0)
Platelets: 148 10*3/uL — ABNORMAL LOW (ref 150–400)
RBC: 5.44 MIL/uL (ref 4.22–5.81)
RDW: 14.4 % (ref 11.5–15.5)
WBC: 5.4 10*3/uL (ref 4.0–10.5)

## 2017-01-11 MED ORDER — IOPAMIDOL (ISOVUE-300) INJECTION 61%
INTRAVENOUS | Status: AC
  Administered 2017-01-11: 100 mL via INTRAVENOUS
  Filled 2017-01-11: qty 100

## 2017-01-11 MED ORDER — FLUTICASONE PROPIONATE 50 MCG/ACT NA SUSP
2.0000 | NASAL | Status: DC | PRN
  Administered 2017-01-11: 2 via NASAL
  Filled 2017-01-11 (×2): qty 16

## 2017-01-11 NOTE — Progress Notes (Signed)
PROGRESS NOTE    Joel LenzObinna Perkins  ZOX:[id number] DOB: 05/03/1980 DOA: 01/09/2017 PCP: Patient, No Pcp Per     Brief Narrative:  Joel Perkins is a 36 yo male with past medical history significant for HTN presented to the hospital with right lower quadrant abdominal pain.  Pain was severe and associated with subjective fevers.  In the emergency department, CT abdomen pelvis revealed a foreign body likely a small bone in the terminal ileum.  Patient does admit to frequent ingestion of chicken bone and fish bones.  GI was consulted.  Assessment & Plan:   Principal Problem:   Foreign body in small intestine Active Problems:   HTN (hypertension)   Foreign body in the small intestine, terminal ileum -GI consulted -Pain control -Continue bowel prep, possibly colonoscopy tomorrow   Essential hypertension -Continue amlodipine, metoprolol  Microcytic anemia -Microcytosis appears chronic, baseline Hgb 12-13    DVT prophylaxis: SCD Code Status: Full Family Communication: Family at bedside Disposition Plan: Pending improvement, GI plan    Consultants:   GI  Procedures:   None   Antimicrobials:  Anti-infectives (From admission, onward)   None       Subjective: Patient continues to have right lower quadrant abdominal pain although it is improved from yesterday.  He is tolerating bowel prep, states that he is actually enjoying the prep and wishes to get this over-the-counter.  No other new issues.    Objective: Vitals:   01/10/17 1423 01/10/17 2049 01/11/17 0413 01/11/17 0950  BP: (!) 155/84 (!) 141/75 (!) 152/86 (!) 163/96  Pulse: 67 64 (!) 50 (!) 106  Resp:  18 16   Temp: 98.1 F (36.7 C) 97.8 F (36.6 C) 98.1 F (36.7 C)   TempSrc: Oral Oral Oral   SpO2: 98% 99% 98%   Weight:      Height:        Intake/Output Summary (Last 24 hours) at 01/11/2017 1323 Last data filed at 01/11/2017 0600 Gross per 24 hour  Intake 2400 ml  Output 0 ml  Net 2400 ml   Filed  Weights   01/09/17 1254 01/09/17 2134  Weight: 90.7 kg (200 lb) 92.9 kg (204 lb 11.2 oz)    Examination:  General exam: Appears calm and comfortable  Respiratory system: Clear to auscultation. Respiratory effort normal. Cardiovascular system: S1 & S2 heard, RRR. No JVD, murmurs, rubs, gallops or clicks. No pedal edema. Gastrointestinal system: Abdomen is nondistended, soft and TTP RLQ. No organomegaly or masses felt. Normal bowel sounds heard. Central nervous system: Alert and oriented. No focal neurological deficits. Extremities: Symmetric 5 x 5 power. Skin: No rashes, lesions or ulcers Psychiatry: Judgement and insight appear normal. Mood & affect appropriate.   Data Reviewed: I have personally reviewed following labs and imaging studies  CBC: Recent Labs  Lab 01/09/17 1507 01/10/17 0436 01/11/17 0819  WBC 8.2 7.3 5.4  NEUTROABS 4.8  --   --   HGB 12.4* 12.3* 12.0*  HCT 38.1* 38.3* 37.3*  MCV 66.3* 68.3* 68.6*  PLT 187 170 148*   Basic Metabolic Panel: Recent Labs  Lab 01/09/17 1507 01/10/17 0436 01/11/17 0819  NA 140 139 140  K 3.4* 3.1* 3.5  CL 102 105 106  CO2 29 29 28   GLUCOSE 91 87 91  BUN 14 13 9   CREATININE 1.08 0.91 0.92  CALCIUM 9.1 8.8* 8.5*   GFR: Estimated Creatinine Clearance: 122.8 mL/min (by C-G formula based on SCr of 0.92 mg/dL). Liver Function Tests: Recent Labs  Lab 01/09/17 1507  AST 19  ALT 20  ALKPHOS 88  BILITOT 1.3*  PROT 7.4  ALBUMIN 4.2   Recent Labs  Lab 01/09/17 1507  LIPASE 29   No results for input(s): AMMONIA in the last 168 hours. Coagulation Profile: No results for input(s): INR, PROTIME in the last 168 hours. Cardiac Enzymes: No results for input(s): CKTOTAL, CKMB, CKMBINDEX, TROPONINI in the last 168 hours. BNP (last 3 results) No results for input(s): PROBNP in the last 8760 hours. HbA1C: No results for input(s): HGBA1C in the last 72 hours. CBG: No results for input(s): GLUCAP in the last 168 hours. Lipid  Profile: No results for input(s): CHOL, HDL, LDLCALC, TRIG, CHOLHDL, LDLDIRECT in the last 72 hours. Thyroid Function Tests: No results for input(s): TSH, T4TOTAL, FREET4, T3FREE, THYROIDAB in the last 72 hours. Anemia Panel: Recent Labs    01/10/17 1345  FERRITIN 126  TIBC 330  IRON 30*   Sepsis Labs: No results for input(s): PROCALCITON, LATICACIDVEN in the last 168 hours.  No results found for this or any previous visit (from the past 240 hour(s)).     Radiology Studies: Dg Abd 1 View  Result Date: 01/11/2017 CLINICAL DATA:  Small-bowel foreign body EXAM: ABDOMEN - 1 VIEW COMPARISON:  01/09/2017 FINDINGS: Scattered large and small bowel gas is noted. No obstructive changes are noted. The previously seen foreign body is not as well appreciated on this exam. Questionable linear density is noted over the right half of the sacrum which may represent the previously seen foreign body. No other focal abnormality is noted. IMPRESSION: The previously seen foreign body is not as well appreciated on the current exam. Questionable density is noted over the right half of the sacrum. Electronically Signed   By: Alcide Clever M.D.   On: 01/11/2017 09:54   Ct Abdomen Pelvis W Contrast  Result Date: 01/09/2017 CLINICAL DATA:  Right lower quadrant abdominal pain for 2 days. EXAM: CT ABDOMEN AND PELVIS WITH CONTRAST TECHNIQUE: Multidetector CT imaging of the abdomen and pelvis was performed using the standard protocol following bolus administration of intravenous contrast. CONTRAST:  ISOVUE-300 IOPAMIDOL (ISOVUE-300) INJECTION 61% COMPARISON:  None. FINDINGS: Lower chest: The lung bases are clear of acute process. No pleural effusion or pulmonary lesions. The heart is normal in size. No pericardial effusion. The distal esophagus and aorta are unremarkable. Hepatobiliary: No focal hepatic lesions or intrahepatic biliary dilatation. The gallbladder is normal. No common bile duct dilatation. Pancreas:  No mass, inflammation or ductal dilatation. Spleen: Normal size.  No focal lesions. Adrenals/Urinary Tract: No renal lesions or hydronephrosis. The bladder appears normal. Stomach/Bowel: The stomach, duodenum, small bowel and colon are unremarkable except for the distal ileum which contains a foreign body the which is most likely a small bone possibly a chicken or fishbone. It measures approximately 23 mm. There is mucosal enhancement, submucosal edema and mild serosal enhancement. I do not see any evidence for perforation but I suspect the foreign body may be projecting through the mucosa and into the wall of the ileum and could be stuck there. I would estimate that this foreign body is approximately 7 cm from the terminal ileum and it may be retrievable via colonoscopy. No findings to suggest a small bowel obstruction. The colon is unremarkable. The appendix is normal. Vascular/Lymphatic: The aorta is normal in caliber. No dissection. The branch vessels are patent. The major venous structures are patent. No mesenteric or retroperitoneal mass or adenopathy. Small scattered lymph nodes are  noted. Reproductive: The prostate gland and seminal vesicles are unremarkable. Other: No pelvic mass or adenopathy. No free pelvic fluid collections. No inguinal mass or adenopathy. Moderate size periumbilical abdominal wall hernia containing fat. Musculoskeletal: No significant bony findings. IMPRESSION: 1. 23 mm thin foreign body in the distal ileum is most likely a small bone of some type. Moderate inflammation of the bowel in this area and I would be worried that the foreign body is stuck, possibly piercing the mucosa. No findings for perforation or small bowel obstruction. It may be accessible via colonoscopy. 2. Normal appendix. 3. Periumbilical abdominal wall hernia containing fat. Electronically Signed   By: Rudie MeyerP.  Gallerani M.D.   On: 01/09/2017 18:52      Scheduled Meds: . amLODipine  10 mg Oral Daily  . metoprolol  tartrate  100 mg Oral BID  . pantoprazole  40 mg Oral Daily   Continuous Infusions: . sodium chloride 100 mL/hr at 01/11/17 1246     LOS: 2 days    Time spent: 30 minutes   Noralee StainJennifer Emanuelle Hammerstrom, DO [facility] [email or web address] Password [email or web address] 01/11/2017, 1:23 PM

## 2017-01-11 NOTE — Progress Notes (Signed)
[facility]  Joel LenzObinna Perkins 36 y.o. 06/05/1980   Subjective: Abdominal pain somewhat better. Moved a lot of loose stools overnight with the colon prep. Family in room.  Objective: Vital signs: Vitals:   01/11/17 0413 01/11/17 0950  BP: (!) 152/86 (!) 163/96  Pulse: (!) 50 (!) 106  Resp: 16   Temp: 98.1 F (36.7 C)   SpO2: 98%     Physical Exam: Gen: alert, no acute distress  HEENT: anicteric sclera CV: RRR Chest: CTA B Abd: RLQ tenderness with guarding (less tender), mild distention, +BS, soft Ext: no edema  Lab Results: Recent Labs    01/10/17 0436 01/11/17 0819  NA 139 140  K 3.1* 3.5  CL 105 106  CO2 29 28  GLUCOSE 87 91  BUN 13 9  CREATININE 0.91 0.92  CALCIUM 8.8* 8.5*   Recent Labs    01/09/17 1507  AST 19  ALT 20  ALKPHOS 88  BILITOT 1.3*  PROT 7.4  ALBUMIN 4.2   Recent Labs    01/09/17 1507 01/10/17 0436 01/11/17 0819  WBC 8.2 7.3 5.4  NEUTROABS 4.8  --   --   HGB 12.4* 12.3* 12.0*  HCT 38.1* 38.3* 37.3*  MCV 66.3* 68.3* 68.6*  PLT 187 170 148*      Assessment/Plan: Foreign body in distal ileum (chicken/fish bone) that appeared lodged in the distal ileal wall (no perforation seen) on CT on admit. S/P colon prep yesterday to try and move the bone into the colon and if not a colonoscopy would be attempted to remove it. Xray does not clearly show whether the bone is still present in the small bowel and would not recommend a colonoscopy to retrieve a bone that may have already advanced out of the ileum into the colon so will repeat a CT scan today. If the bone is still in the small bowel then will have one of my partners do a colonoscopy tomorrow. If the bone is not visible on the repeat CT then will slowly advance diet and consider d/c tomorrow. Clear liquid diet today. NPO p MN in case colonoscopy is needed. Dr. Arita MissKarki/Edwards will f/u tomorrow.   [name] 01/11/2017, 2:13 PM  Pager [phone]  AFTER 5  PM or on weekends please call [phone] ID: Joel LenzObinna Perkins, male   DOB: 06/25/1980, 36 y.o.   MRN: [id number]

## 2017-01-11 NOTE — Plan of Care (Signed)
  Pain Managment: General experience of comfort will improve 01/11/2017 1719 - Progressing by Shirleen Schirmerraegbunam, Cambreigh Dearing K, RN

## 2017-01-12 DIAGNOSIS — E876 Hypokalemia: Secondary | ICD-10-CM

## 2017-01-12 LAB — BASIC METABOLIC PANEL
Anion gap: 7 (ref 5–15)
BUN: 7 mg/dL (ref 6–20)
CO2: 29 mmol/L (ref 22–32)
Calcium: 9.2 mg/dL (ref 8.9–10.3)
Chloride: 105 mmol/L (ref 101–111)
Creatinine, Ser: 0.91 mg/dL (ref 0.61–1.24)
GFR calc Af Amer: >60 mL/min (ref >60)
GFR calc non Af Amer: >60 mL/min (ref >60)
Glucose, Bld: 94 mg/dL (ref 65–99)
Potassium: 3 mmol/L — ABNORMAL LOW (ref 3.5–5.1)
Sodium: 141 mmol/L (ref 135–145)

## 2017-01-12 LAB — CBC
HCT: 37.8 % — ABNORMAL LOW (ref 39.0–52.0)
Hemoglobin: 12 g/dL — ABNORMAL LOW (ref 13.0–17.0)
MCH: 21.8 pg — ABNORMAL LOW (ref 26.0–34.0)
MCHC: 31.7 g/dL (ref 30.0–36.0)
MCV: 68.6 fL — ABNORMAL LOW (ref 78.0–100.0)
Platelets: 170 10*3/uL (ref 150–400)
RBC: 5.51 MIL/uL (ref 4.22–5.81)
RDW: 14.3 % (ref 11.5–15.5)
WBC: 4.4 10*3/uL (ref 4.0–10.5)

## 2017-01-12 MED ORDER — LISINOPRIL 20 MG PO TABS
20.0000 mg | ORAL_TABLET | Freq: Every day | ORAL | Status: DC
  Administered 2017-01-12 – 2017-01-13 (×2): 20 mg via ORAL
  Filled 2017-01-12 (×2): qty 1

## 2017-01-12 MED ORDER — METOPROLOL TARTRATE 50 MG PO TABS
50.0000 mg | ORAL_TABLET | Freq: Two times a day (BID) | ORAL | Status: DC
  Filled 2017-01-12: qty 1

## 2017-01-12 MED ORDER — HYDRALAZINE HCL 20 MG/ML IJ SOLN: 10.0000 mg | Freq: Four times a day (QID) | INTRAMUSCULAR | Status: DC | PRN

## 2017-01-12 MED ORDER — POTASSIUM CHLORIDE 10 MEQ/100ML IV SOLN
10.0000 meq | INTRAVENOUS | Status: AC
  Administered 2017-01-12 (×4): 10 meq via INTRAVENOUS
  Filled 2017-01-12 (×4): qty 100

## 2017-01-12 MED ORDER — POLYETHYLENE GLYCOL 3350 17 G PO PACK
17.0000 g | PACK | Freq: Three times a day (TID) | ORAL | Status: DC
  Administered 2017-01-12 (×2): 17 g via ORAL
  Filled 2017-01-12 (×4): qty 1

## 2017-01-12 MED ORDER — POTASSIUM CHLORIDE CRYS ER 20 MEQ PO TBCR
20.0000 meq | EXTENDED_RELEASE_TABLET | Freq: Once | ORAL | Status: AC
  Administered 2017-01-12: 20 meq via ORAL
  Filled 2017-01-12 (×2): qty 1

## 2017-01-12 NOTE — Progress Notes (Signed)
[facility] PROGRESS NOTE  Joel LenzObinna Perkins WUJ:[id number] DOB: 11/20/1980 DOA: 01/09/2017  PCP: Patient, No Pcp Per  Brief History/Interval Summary: 36 year old male with a past medical history of hypertension who is followed by primary care physician in [place] but patient lives in [place] who presented with right lower quadrant abdominal pain.  CT scan revealed likely foreign body in the terminal ileum.  This was thought to be a bone.  Patient was hospitalized and gastroenterology was consulted.  Reason for Visit: Foreign body terminal ileum  Consultants: Gastroenterology  Procedures: None yet  Antibiotics: None  Subjective/Interval History: Patient states that he is feeling well.  He had multiple bowel movements yesterday.  Continues to have some abdominal discomfort but denies any nausea or vomiting.  ROS: Denies any chest pain or shortness of breath.  Objective:  Vital Signs  Vitals:   01/11/17 2254 01/12/17 0453 01/12/17 0900 01/12/17 1411  BP: (!) 160/94 (!) 168/103 (!) 169/85 (!) 161/96  Pulse: 64 (!) 50 (!) 45 73  Resp:  18  16  Temp:  98.3 F (36.8 C)  98.5 F (36.9 C)  TempSrc:  Oral  Oral  SpO2:  100% 97% 99%  Weight:      Height:        Intake/Output Summary (Last 24 hours) at 01/12/2017 1422 Last data filed at 01/12/2017 1300 Gross per 24 hour  Intake 3280 ml  Output -  Net 3280 ml   Filed Weights   01/09/17 1254 01/09/17 2134  Weight: 90.7 kg (200 lb) 92.9 kg (204 lb 11.2 oz)    General appearance: alert, cooperative, appears stated age and no distress Head: Normocephalic, without obvious abnormality, atraumatic Resp: clear to auscultation bilaterally Cardio: regular rate and rhythm, S1, S2 normal, no murmur, click, rub or gallop GI: Abdomen is soft.  Mildly tender in the mid abdomen without any rebound rigidity or guarding.  No masses organomegaly. Extremities: extremities normal, atraumatic, no cyanosis or edema Neurologic: No focal  neurological deficits appreciated.  Lab Results:  Data Reviewed: I have personally reviewed following labs and imaging studies  CBC: Recent Labs  Lab 01/09/17 1507 01/10/17 0436 01/11/17 0819 01/12/17 0452  WBC 8.2 7.3 5.4 4.4  NEUTROABS 4.8  --   --   --   HGB 12.4* 12.3* 12.0* 12.0*  HCT 38.1* 38.3* 37.3* 37.8*  MCV 66.3* 68.3* 68.6* 68.6*  PLT 187 170 148* 170    Basic Metabolic Panel: Recent Labs  Lab 01/09/17 1507 01/10/17 0436 01/11/17 0819 01/12/17 0452  NA 140 139 140 141  K 3.4* 3.1* 3.5 3.0*  CL 102 105 106 105  CO2 29 29 28 29   GLUCOSE 91 87 91 94  BUN 14 13 9 7   CREATININE 1.08 0.91 0.92 0.91  CALCIUM 9.1 8.8* 8.5* 9.2    GFR: Estimated Creatinine Clearance: 124.1 mL/min (by C-G formula based on SCr of 0.91 mg/dL).  Liver Function Tests: Recent Labs  Lab 01/09/17 1507  AST 19  ALT 20  ALKPHOS 88  BILITOT 1.3*  PROT 7.4  ALBUMIN 4.2    Recent Labs  Lab 01/09/17 1507  LIPASE 29    Anemia Panel: Recent Labs    01/10/17 1345  FERRITIN 126  TIBC 330  IRON 30*     Radiology Studies: Dg Abd 1 View  Result Date: 01/11/2017 CLINICAL DATA:  Small-bowel foreign body EXAM: ABDOMEN - 1 VIEW COMPARISON:  01/09/2017 FINDINGS: Scattered large and small bowel gas is noted. No obstructive changes  are noted. The previously seen foreign body is not as well appreciated on this exam. Questionable linear density is noted over the right half of the sacrum which may represent the previously seen foreign body. No other focal abnormality is noted. IMPRESSION: The previously seen foreign body is not as well appreciated on the current exam. Questionable density is noted over the right half of the sacrum. Electronically Signed   By: Alcide CleverMark  Lukens M.D.   On: 01/11/2017 09:54   Ct Abdomen Pelvis W Contrast  Result Date: 01/11/2017 CLINICAL DATA:  Right lower quadrant pain for several days. Radiopaque foreign body in distal small bowel. EXAM: CT ABDOMEN AND  PELVIS WITH CONTRAST TECHNIQUE: Multidetector CT imaging of the abdomen and pelvis was performed using the standard protocol following bolus administration of intravenous contrast. CONTRAST:  100 mL ISOVUE-300 IOPAMIDOL (ISOVUE-300) INJECTION 61% COMPARISON:  01/09/2017 FINDINGS: Lower Chest: No acute findings. Hepatobiliary: No hepatic masses identified. Gallbladder is unremarkable. Pancreas:  No mass or inflammatory changes. Spleen: Within normal limits in size and appearance. Adrenals/Urinary Tract: No masses identified. No evidence of hydronephrosis. Unremarkable unopacified urinary bladder. Stomach/Bowel: No evidence of obstruction, inflammatory process or abnormal fluid collections. Normal appendix visualized. Previously seen linear radiodensity in the distal ileum is no longer visualized. This is no longer visualized elsewhere within the small or large bowel. No evidence of extraluminal fluid or gas. Vascular/Lymphatic: No pathologically enlarged lymph nodes. No abdominal aortic aneurysm. Reproductive:  No mass or other significant abnormality. Other:  Small paraumbilical hernia containing only fat is unchanged. Musculoskeletal:  No suspicious bone lesions identified. IMPRESSION: Previously seen linear radiodense foreign body in the distal ileum is no longer visualized. No acute findings. Stable small fat-containing paraumbilical hernia. Electronically Signed   By: Myles RosenthalJohn  Stahl M.D.   On: 01/11/2017 23:35     Medications:  Scheduled: . amLODipine  10 mg Oral Daily  . [START ON 01/13/2017] metoprolol tartrate  50 mg Oral BID  . pantoprazole  40 mg Oral Daily  . polyethylene glycol  17 g Oral TID   Continuous: . sodium chloride 100 mL/hr at 01/12/17 1152   WUJ:[id number]:acetaminophen **OR** acetaminophen, fluticasone, ondansetron **OR** ondansetron (ZOFRAN) IV, sodium chloride  Assessment/Plan:  Principal Problem:   Foreign body in small intestine Active Problems:   HTN (hypertension)    Foreign  body in the terminal ileum The foreign body is most likely bone.  Patient admitted to frequent ingestion of chicken and fish bones.  Gastroenterology was consulted.  Patient underwent bowel prep.  Repeat CT scan done on 12/30 does not show foreign object noted on previous CT scan.  It is quite possible patient may have passed the bone in the multiple bowel movements that he had yesterday.  Colonoscopy on hold for now.  He has been started on clear liquid diet.  If he remains stable he could be discharged tomorrow.  Essential hypertension Continue home medications.  Heart rate noted to be low in the 40s.  Hold his beta-blocker for now.  May need a lower dose at discharge.  Resume his lisinopril.  Microcytic anemia Microcytosis appears to be chronic.  Hemoglobin is stable.  No evidence for overt bleeding.  Further management as outpatient.  Hypokalemia This will be repleted.  DVT Prophylaxis: SCDs.  Mobilize.    Code Status: Full code Family Communication: Discussed with the patient Disposition Plan: Mobilize.  Management as outlined above.  Anticipate discharge tomorrow.    LOS: 3 days   Wells Fargookul Tyshawn Ciullo  [facility]  Pager [phone] 01/12/2017, 2:22 PM  If 7PM-7AM, please contact night-coverage at [email or web address], password [facility]

## 2017-01-12 NOTE — Progress Notes (Signed)
[facility] PROGRESS NOTE Subjective Patient still having some abdominal pain but states that he is much better.  The CT scan was obtained yesterday and there was no sign of the previously seen radiodense foreign body in the distal ileum and no other acute findings.  The patient remains on clear liquids but is still requiring IV morphine and antinausea medicine.  Objective: Vital signs in last 24 hours: Temp:  [98.3 F (36.8 C)-98.5 F (36.9 C)] 98.3 F (36.8 C) (12/31 0453) Pulse Rate:  [45-64] 45 (12/31 0900) Resp:  [18] 18 (12/31 0453) BP: (156-169)/(85-103) 169/85 (12/31 0900) SpO2:  [97 %-100 %] 97 % (12/31 0900) Last BM Date: 01/11/17  Intake/Output from previous day: 12/30 0701 - 12/31 0700 In: 3240 [P.O.:840; I.V.:2400] Out: -  Intake/Output this shift: No intake/output data recorded.  PE: General--patient in no obvious distress watching television family in the room  Abdomen--nondistended and soft with good bowel sounds.  Lab Results: Recent Labs    01/09/17 1507 01/10/17 0436 01/11/17 0819 01/12/17 0452  WBC 8.2 7.3 5.4 4.4  HGB 12.4* 12.3* 12.0* 12.0*  HCT 38.1* 38.3* 37.3* 37.8*  PLT 187 170 148* 170   BMET Recent Labs    01/09/17 1507 01/10/17 0436 01/11/17 0819 01/12/17 0452  NA 140 139 140 141  K 3.4* 3.1* 3.5 3.0*  CL 102 105 106 105  CO2 29 29 28 29   CREATININE 1.08 0.91 0.92 0.91   LFT Recent Labs    01/09/17 1507  PROT 7.4  AST 19  ALT 20  ALKPHOS 88  BILITOT 1.3*   PT/INR No results for input(s): LABPROT, INR in the last 72 hours. PANCREAS Recent Labs    01/09/17 1507  LIPASE 29         Studies/Results: Dg Abd 1 View  Result Date: 01/11/2017 CLINICAL DATA:  Small-bowel foreign body EXAM: ABDOMEN - 1 VIEW COMPARISON:  01/09/2017 FINDINGS: Scattered large and small bowel gas is noted. No obstructive changes are noted. The previously seen foreign body is not as well appreciated on this exam. Questionable  linear density is noted over the right half of the sacrum which may represent the previously seen foreign body. No other focal abnormality is noted. IMPRESSION: The previously seen foreign body is not as well appreciated on the current exam. Questionable density is noted over the right half of the sacrum. Electronically Signed   By: Alcide CleverMark  Lukens M.D.   On: 01/11/2017 09:54   Ct Abdomen Pelvis W Contrast  Result Date: 01/11/2017 CLINICAL DATA:  Right lower quadrant pain for several days. Radiopaque foreign body in distal small bowel. EXAM: CT ABDOMEN AND PELVIS WITH CONTRAST TECHNIQUE: Multidetector CT imaging of the abdomen and pelvis was performed using the standard protocol following bolus administration of intravenous contrast. CONTRAST:  100 mL ISOVUE-300 IOPAMIDOL (ISOVUE-300) INJECTION 61% COMPARISON:  01/09/2017 FINDINGS: Lower Chest: No acute findings. Hepatobiliary: No hepatic masses identified. Gallbladder is unremarkable. Pancreas:  No mass or inflammatory changes. Spleen: Within normal limits in size and appearance. Adrenals/Urinary Tract: No masses identified. No evidence of hydronephrosis. Unremarkable unopacified urinary bladder. Stomach/Bowel: No evidence of obstruction, inflammatory process or abnormal fluid collections. Normal appendix visualized. Previously seen linear radiodensity in the distal ileum is no longer visualized. This is no longer visualized elsewhere within the small or large bowel. No evidence of extraluminal fluid or gas. Vascular/Lymphatic: No pathologically enlarged lymph nodes. No abdominal aortic aneurysm. Reproductive:  No mass or other significant abnormality. Other:  Small paraumbilical hernia  containing only fat is unchanged. Musculoskeletal:  No suspicious bone lesions identified. IMPRESSION: Previously seen linear radiodense foreign body in the distal ileum is no longer visualized. No acute findings. Stable small fat-containing paraumbilical hernia. Electronically  Signed   By: Myles RosenthalJohn  Stahl M.D.   On: 01/11/2017 23:35    Medications: I have reviewed the patient's current medications.  Assessment:   1.  Intestinal foreign body.  This was in the distal ileum and appears to have passed since it is no longer visible in that area on CT scan.  The patient's pain is better but he is still having some pain.  He has had a colonoscopy prep.  He is not clearly passed any foreign body.  He eats a lot of chicken and is felt this could be a piece of chicken bone which would still place him at risk of perforation of the colon.  His WBC count is still elevated as well.   Plan: 1. Would keep him on clear liquid diet for now and will add MiraLAX so that he continues to have bowel movements.  Would check his WBC count in the morning. 2.  I would stop narcotic pain medications.  It is difficult to determine how significant his pain is if he is still requiring pain medication.  We will check in the morning and hopefully he will be significantly improved and could consider discharge at that time.   Tresea MallJames L Taraann Olthoff 01/12/2017, 11:35 AM  This note was created using voice recognition software. Minor errors may Have occurred unintentionally.  Pager: [phone] If no answer or after hours call [phone]

## 2017-01-13 LAB — BASIC METABOLIC PANEL
Anion gap: 9 (ref 5–15)
BUN: 7 mg/dL (ref 6–20)
CO2: 25 mmol/L (ref 22–32)
Calcium: 9 mg/dL (ref 8.9–10.3)
Chloride: 105 mmol/L (ref 101–111)
Creatinine, Ser: 0.92 mg/dL (ref 0.61–1.24)
GFR calc Af Amer: >60 mL/min (ref >60)
GFR calc non Af Amer: >60 mL/min (ref >60)
Glucose, Bld: 86 mg/dL (ref 65–99)
Potassium: 3.3 mmol/L — ABNORMAL LOW (ref 3.5–5.1)
Sodium: 139 mmol/L (ref 135–145)

## 2017-01-13 LAB — CBC WITH DIFFERENTIAL/PLATELET
Basophils Absolute: 0 10*3/uL (ref 0.0–0.1)
Basophils Relative: 0 %
Eosinophils Absolute: 0.2 10*3/uL (ref 0.0–0.7)
Eosinophils Relative: 6 %
HCT: 37 % — ABNORMAL LOW (ref 39.0–52.0)
Hemoglobin: 12 g/dL — ABNORMAL LOW (ref 13.0–17.0)
Lymphocytes Relative: 58 %
Lymphs Abs: 2.4 10*3/uL (ref 0.7–4.0)
MCH: 22 pg — ABNORMAL LOW (ref 26.0–34.0)
MCHC: 32.4 g/dL (ref 30.0–36.0)
MCV: 67.9 fL — ABNORMAL LOW (ref 78.0–100.0)
Monocytes Absolute: 0.4 10*3/uL (ref 0.1–1.0)
Monocytes Relative: 9 %
Neutro Abs: 1.1 10*3/uL — ABNORMAL LOW (ref 1.7–7.7)
Neutrophils Relative %: 27 %
Platelets: 167 10*3/uL (ref 150–400)
RBC: 5.45 MIL/uL (ref 4.22–5.81)
RDW: 14.2 % (ref 11.5–15.5)
WBC: 4.1 10*3/uL (ref 4.0–10.5)

## 2017-01-13 LAB — MAGNESIUM: Magnesium: 1.8 mg/dL (ref 1.7–2.4)

## 2017-01-13 MED ORDER — POTASSIUM CHLORIDE CRYS ER 20 MEQ PO TBCR
40.0000 meq | EXTENDED_RELEASE_TABLET | Freq: Once | ORAL | Status: AC
  Administered 2017-01-13: 40 meq via ORAL
  Filled 2017-01-13: qty 2

## 2017-01-13 MED ORDER — POLYETHYLENE GLYCOL 3350 17 G PO PACK: 17.0000 g | PACK | Freq: Every day | ORAL | 0 refills | Status: DC

## 2017-01-13 NOTE — Progress Notes (Signed)
GI Discharge/Sign-Off Note  Subjective: patient without any further pain. It's not had any pain medication since yesterday. Repeat CT scan showed that the foreign body which is presumed to be a chicken bone that was initially stuck in the terminal ileum had passed from the terminal ileum and could not be seen. White count is been normal no free air etc.  Principal Problem:   Foreign body in small intestine Active Problems:   HTN (hypertension)   Results for orders placed or performed during the hospital encounter of 01/09/17 (from the past 72 hour(s))  Iron and TIBC     Status: Abnormal   Collection Time: 01/10/17  1:45 PM  Result Value Ref Range   Iron 30 (L) 45 - 182 ug/dL   TIBC 330 250 - 450 ug/dL   Saturation Ratios 9 (L) 17.9 - 39.5 %   UIBC 300 ug/dL    Comment: Performed at [facility], [place] [address] [place], [place] [address]  Ferritin     Status: None   Collection Time: 01/10/17  1:45 PM  Result Value Ref Range   Ferritin 126 24 - 336 ng/mL    Comment: Performed at [place] [address] [place], [place] [address]  CBC     Status: Abnormal   Collection Time: 01/11/17  8:19 AM  Result Value Ref Range   WBC 5.4 4.0 - 10.5 K/uL   RBC 5.44 4.22 - 5.81 MIL/uL   Hemoglobin 12.0 (L) 13.0 - 17.0 g/dL   HCT 37.3 (L) 39.0 - 52.0 %   MCV 68.6 (L) 78.0 - 100.0 fL   MCH 22.1 (L) 26.0 - 34.0 pg   MCHC 32.2 30.0 - 36.0 g/dL   RDW 14.4 11.5 - 15.5 %   Platelets 148 (L) 150 - 400 K/uL    Comment: REPEATED TO VERIFY SPECIMEN CHECKED FOR CLOTS   Basic metabolic panel     Status: Abnormal   Collection Time: 01/11/17  8:19 AM  Result Value Ref Range   Sodium 140 135 - 145 mmol/L   Potassium 3.5 3.5 - 5.1 mmol/L   Chloride 106 101 - 111 mmol/L   CO2 28 22 - 32 mmol/L   Glucose, Bld 91 65 - 99 mg/dL   BUN 9 6 - 20 mg/dL   Creatinine, Ser 0.92 0.61 - 1.24 mg/dL   Calcium 8.5 (L) 8.9 - 10.3 mg/dL   GFR calc non Af Amer >60 >60 mL/min   GFR calc Af Amer >60  >60 mL/min    Comment: (NOTE) The eGFR has been calculated using the CKD EPI equation. This calculation has not been validated in all clinical situations. eGFR's persistently <60 mL/min signify possible Chronic Kidney Disease.    Anion gap 6 5 - 15  Basic metabolic panel     Status: Abnormal   Collection Time: 01/12/17  4:52 AM  Result Value Ref Range   Sodium 141 135 - 145 mmol/L   Potassium 3.0 (L) 3.5 - 5.1 mmol/L   Chloride 105 101 - 111 mmol/L   CO2 29 22 - 32 mmol/L   Glucose, Bld 94 65 - 99 mg/dL   BUN 7 6 - 20 mg/dL   Creatinine, Ser 0.91 0.61 - 1.24 mg/dL   Calcium 9.2 8.9 - 10.3 mg/dL   GFR calc non Af Amer >60 >60 mL/min   GFR calc Af Amer >60 >60 mL/min    Comment: (NOTE) The eGFR has been calculated using the CKD  EPI equation. This calculation has not been validated in all clinical situations. eGFR's persistently <60 mL/min signify possible Chronic Kidney Disease.    Anion gap 7 5 - 15  CBC     Status: Abnormal   Collection Time: 01/12/17  4:52 AM  Result Value Ref Range   WBC 4.4 4.0 - 10.5 K/uL   RBC 5.51 4.22 - 5.81 MIL/uL   Hemoglobin 12.0 (L) 13.0 - 17.0 g/dL   HCT 37.8 (L) 39.0 - 52.0 %   MCV 68.6 (L) 78.0 - 100.0 fL   MCH 21.8 (L) 26.0 - 34.0 pg   MCHC 31.7 30.0 - 36.0 g/dL   RDW 14.3 11.5 - 15.5 %   Platelets 170 150 - 400 K/uL    Comment: REPEATED TO VERIFY SPECIMEN CHECKED FOR CLOTS PLATELET COUNT CONFIRMED BY SMEAR   CBC with Differential/Platelet     Status: Abnormal   Collection Time: 01/13/17  4:50 AM  Result Value Ref Range   WBC 4.1 4.0 - 10.5 K/uL   RBC 5.45 4.22 - 5.81 MIL/uL   Hemoglobin 12.0 (L) 13.0 - 17.0 g/dL   HCT 37.0 (L) 39.0 - 52.0 %   MCV 67.9 (L) 78.0 - 100.0 fL   MCH 22.0 (L) 26.0 - 34.0 pg   MCHC 32.4 30.0 - 36.0 g/dL   RDW 14.2 11.5 - 15.5 %   Platelets 167 150 - 400 K/uL    Comment: REPEATED TO VERIFY   Neutrophils Relative % 27 %   Lymphocytes Relative 58 %   Monocytes Relative 9 %   Eosinophils Relative 6 %    Basophils Relative 0 %   Neutro Abs 1.1 (L) 1.7 - 7.7 K/uL   Lymphs Abs 2.4 0.7 - 4.0 K/uL   Monocytes Absolute 0.4 0.1 - 1.0 K/uL   Eosinophils Absolute 0.2 0.0 - 0.7 K/uL   Basophils Absolute 0.0 0.0 - 0.1 K/uL   Smear Review MORPHOLOGY UNREMARKABLE   Basic metabolic panel     Status: Abnormal   Collection Time: 01/13/17  4:50 AM  Result Value Ref Range   Sodium 139 135 - 145 mmol/L   Potassium 3.3 (L) 3.5 - 5.1 mmol/L   Chloride 105 101 - 111 mmol/L   CO2 25 22 - 32 mmol/L   Glucose, Bld 86 65 - 99 mg/dL   BUN 7 6 - 20 mg/dL   Creatinine, Ser 0.92 0.61 - 1.24 mg/dL   Calcium 9.0 8.9 - 10.3 mg/dL   GFR calc non Af Amer >60 >60 mL/min   GFR calc Af Amer >60 >60 mL/min    Comment: (NOTE) The eGFR has been calculated using the CKD EPI equation. This calculation has not been validated in all clinical situations. eGFR's persistently <60 mL/min signify possible Chronic Kidney Disease.    Anion gap 9 5 - 15  Magnesium     Status: None   Collection Time: 01/13/17  4:50 AM  Result Value Ref Range   Magnesium 1.8 1.7 - 2.4 mg/dL    Ct Abdomen Pelvis W Contrast  Result Date: 01/11/2017 CLINICAL DATA:  Right lower quadrant pain for several days. Radiopaque foreign body in distal small bowel. EXAM: CT ABDOMEN AND PELVIS WITH CONTRAST TECHNIQUE: Multidetector CT imaging of the abdomen and pelvis was performed using the standard protocol following bolus administration of intravenous contrast. CONTRAST:  100 mL ISOVUE-300 IOPAMIDOL (ISOVUE-300) INJECTION 61% COMPARISON:  01/09/2017 FINDINGS: Lower Chest: No acute findings. Hepatobiliary: No hepatic masses identified. Gallbladder is unremarkable. Pancreas:  No mass or inflammatory changes. Spleen: Within normal limits in size and appearance. Adrenals/Urinary Tract: No masses identified. No evidence of hydronephrosis. Unremarkable unopacified urinary bladder. Stomach/Bowel: No evidence of obstruction, inflammatory process or abnormal fluid  collections. Normal appendix visualized. Previously seen linear radiodensity in the distal ileum is no longer visualized. This is no longer visualized elsewhere within the small or large bowel. No evidence of extraluminal fluid or gas. Vascular/Lymphatic: No pathologically enlarged lymph nodes. No abdominal aortic aneurysm. Reproductive:  No mass or other significant abnormality. Other:  Small paraumbilical hernia containing only fat is unchanged. Musculoskeletal:  No suspicious bone lesions identified. IMPRESSION: Previously seen linear radiodense foreign body in the distal ileum is no longer visualized. No acute findings. Stable small fat-containing paraumbilical hernia. Electronically Signed   By: Earle Gell M.D.   On: 01/11/2017 23:35      GI DISCHARGE PLANNING:  Diet:  regular  Anticoagulation/Antiplatelet Rx Suggestions: none  GI Medications: suggest Miralax or milk of magnesia for several days  Labs/Procedures Ordered:  GI FOLLOW UP:  Call [phone] to make appointment.   Doctor: no follow-up needed in less he has further pain or symptoms. Patient instructed to come back to the ER if needed.  Time:     Laurence Spates, MD   Pager [phone] If no answer or after hours call [phone]

## 2017-01-13 NOTE — Discharge Summary (Signed)
[facility]  Physician Discharge Summary   Patient ID: Joel Perkins MRN: [id number] DOB/AGE: 37-Jul-1982 37 y.o.  Admit date: 01/09/2017 Discharge date: 01/13/2017  PCP: His PCP is located in [place].  DISCHARGE DIAGNOSES:  Principal Problem:   Foreign body in small intestine Active Problems:   HTN (hypertension)   RECOMMENDATIONS FOR OUTPATIENT FOLLOW UP: 1. Patient instructed to go to the emergency department if his symptoms recur. 2. Patient has to follow-up with his primary care provider for further management of hypertension 3. Further workup for microcytic anemia to be pursued as outpatient.   DISCHARGE CONDITION: fair  Diet recommendation: Soft diet for a few days and then as before  [facility] Weights   01/09/17 1254 01/09/17 2134  Weight: 90.7 kg (200 lb) 92.9 kg (204 lb 11.2 oz)    INITIAL HISTORY: 37 year old male with a past medical history of hypertension who is followed by primary care physician in [place] but patient lives in [place] who presented with right lower quadrant abdominal pain.  CT scan revealed likely foreign body in the terminal ileum.  This was thought to be a bone.  Patient was hospitalized and gastroenterology was consulted.   Consultations:  Gastroenterology  Procedures:  None   HOSPITAL COURSE:   Foreign body in the terminal ileum The foreign body is most likely bone.  Patient admitted to frequent ingestion of chicken and fish bones.  Gastroenterology was consulted.  Patient underwent bowel prep.    Patient had multiple bowel movements as a result of the bowel prep.  Repeat CT scan done on 12/30 does not show foreign object noted on previous CT scan.  It is quite possible patient may have passed the bone in the multiple bowel movements.  His diet was advanced.  Pain has resolved.  Denies any nausea vomiting.  Tolerating his diet.  Per gastroenterology there is no need for endoscopy at this point in time.  Stable for  discharge.  Essential hypertension Patient was continued on his home medications.  He was noted to get significantly bradycardic with heart rate in the 40s.   His beta-blocker was held.  Patient mentioned that whenever he takes his metoprolol he gets really fatigued.  Quite possible that the patient may be experiencing symptoms of bradycardia.  In view of this metoprolol has been discontinued.  He has been asked to continue with his other blood pressure medications and follow-up with his primary care physician for further management.    Microcytic anemia Microcytosis appears to be chronic.  Hemoglobin is stable.  No evidence for overt bleeding.  Further management as outpatient.  Hypokalemia He will continue taking his home potassium dose.  Overall stable.  Okay for discharge home today.    PERTINENT LABS:  The results of significant diagnostics from this hospitalization (including imaging, microbiology, ancillary and laboratory) are listed below for reference.     Labs: Basic Metabolic Panel: Recent Labs  Lab 01/09/17 1507 01/10/17 0436 01/11/17 0819 01/12/17 0452 01/13/17 0450  NA 140 139 140 141 139  K 3.4* 3.1* 3.5 3.0* 3.3*  CL 102 105 106 105 105  CO2 29 29 28 29 25   GLUCOSE 91 87 91 94 86  BUN 14 13 9 7 7   CREATININE 1.08 0.91 0.92 0.91 0.92  CALCIUM 9.1 8.8* 8.5* 9.2 9.0  MG  --   --   --   --  1.8   Liver Function Tests: Recent Labs  Lab 01/09/17 1507  AST 19  ALT 20  ALKPHOS 88  BILITOT 1.3*  PROT 7.4  ALBUMIN 4.2   Recent Labs  Lab 01/09/17 1507  LIPASE 29   CBC: Recent Labs  Lab 01/09/17 1507 01/10/17 0436 01/11/17 0819 01/12/17 0452 01/13/17 0450  WBC 8.2 7.3 5.4 4.4 4.1  NEUTROABS 4.8  --   --   --  1.1*  HGB 12.4* 12.3* 12.0* 12.0* 12.0*  HCT 38.1* 38.3* 37.3* 37.8* 37.0*  MCV 66.3* 68.3* 68.6* 68.6* 67.9*  PLT 187 170 148* 170 167    IMAGING STUDIES Dg Abd 1 View  Result Date: 01/11/2017 CLINICAL DATA:  Small-bowel foreign  body EXAM: ABDOMEN - 1 VIEW COMPARISON:  01/09/2017 FINDINGS: Scattered large and small bowel gas is noted. No obstructive changes are noted. The previously seen foreign body is not as well appreciated on this exam. Questionable linear density is noted over the right half of the sacrum which may represent the previously seen foreign body. No other focal abnormality is noted. IMPRESSION: The previously seen foreign body is not as well appreciated on the current exam. Questionable density is noted over the right half of the sacrum. Electronically Signed   By: Alcide Clever M.D.   On: 01/11/2017 09:54   Ct Abdomen Pelvis W Contrast  Result Date: 01/11/2017 CLINICAL DATA:  Right lower quadrant pain for several days. Radiopaque foreign body in distal small bowel. EXAM: CT ABDOMEN AND PELVIS WITH CONTRAST TECHNIQUE: Multidetector CT imaging of the abdomen and pelvis was performed using the standard protocol following bolus administration of intravenous contrast. CONTRAST:  100 mL ISOVUE-300 IOPAMIDOL (ISOVUE-300) INJECTION 61% COMPARISON:  01/09/2017 FINDINGS: Lower Chest: No acute findings. Hepatobiliary: No hepatic masses identified. Gallbladder is unremarkable. Pancreas:  No mass or inflammatory changes. Spleen: Within normal limits in size and appearance. Adrenals/Urinary Tract: No masses identified. No evidence of hydronephrosis. Unremarkable unopacified urinary bladder. Stomach/Bowel: No evidence of obstruction, inflammatory process or abnormal fluid collections. Normal appendix visualized. Previously seen linear radiodensity in the distal ileum is no longer visualized. This is no longer visualized elsewhere within the small or large bowel. No evidence of extraluminal fluid or gas. Vascular/Lymphatic: No pathologically enlarged lymph nodes. No abdominal aortic aneurysm. Reproductive:  No mass or other significant abnormality. Other:  Small paraumbilical hernia containing only fat is unchanged. Musculoskeletal:   No suspicious bone lesions identified. IMPRESSION: Previously seen linear radiodense foreign body in the distal ileum is no longer visualized. No acute findings. Stable small fat-containing paraumbilical hernia. Electronically Signed   By: Myles Rosenthal M.D.   On: 01/11/2017 23:35   Ct Abdomen Pelvis W Contrast  Result Date: 01/09/2017 CLINICAL DATA:  Right lower quadrant abdominal pain for 2 days. EXAM: CT ABDOMEN AND PELVIS WITH CONTRAST TECHNIQUE: Multidetector CT imaging of the abdomen and pelvis was performed using the standard protocol following bolus administration of intravenous contrast. CONTRAST:  ISOVUE-300 IOPAMIDOL (ISOVUE-300) INJECTION 61% COMPARISON:  None. FINDINGS: Lower chest: The lung bases are clear of acute process. No pleural effusion or pulmonary lesions. The heart is normal in size. No pericardial effusion. The distal esophagus and aorta are unremarkable. Hepatobiliary: No focal hepatic lesions or intrahepatic biliary dilatation. The gallbladder is normal. No common bile duct dilatation. Pancreas: No mass, inflammation or ductal dilatation. Spleen: Normal size.  No focal lesions. Adrenals/Urinary Tract: No renal lesions or hydronephrosis. The bladder appears normal. Stomach/Bowel: The stomach, duodenum, small bowel and colon are unremarkable except for the distal ileum which contains a foreign body the which is most likely a small  bone possibly a chicken or fishbone. It measures approximately 23 mm. There is mucosal enhancement, submucosal edema and mild serosal enhancement. I do not see any evidence for perforation but I suspect the foreign body may be projecting through the mucosa and into the wall of the ileum and could be stuck there. I would estimate that this foreign body is approximately 7 cm from the terminal ileum and it may be retrievable via colonoscopy. No findings to suggest a small bowel obstruction. The colon is unremarkable. The appendix is normal.  Vascular/Lymphatic: The aorta is normal in caliber. No dissection. The branch vessels are patent. The major venous structures are patent. No mesenteric or retroperitoneal mass or adenopathy. Small scattered lymph nodes are noted. Reproductive: The prostate gland and seminal vesicles are unremarkable. Other: No pelvic mass or adenopathy. No free pelvic fluid collections. No inguinal mass or adenopathy. Moderate size periumbilical abdominal wall hernia containing fat. Musculoskeletal: No significant bony findings. IMPRESSION: 1. 23 mm thin foreign body in the distal ileum is most likely a small bone of some type. Moderate inflammation of the bowel in this area and I would be worried that the foreign body is stuck, possibly piercing the mucosa. No findings for perforation or small bowel obstruction. It may be accessible via colonoscopy. 2. Normal appendix. 3. Periumbilical abdominal wall hernia containing fat. Electronically Signed   By: Rudie Meyer M.D.   On: 01/09/2017 18:52    DISCHARGE EXAMINATION: Vitals:   01/12/17 1411 01/12/17 1704 01/12/17 2121 01/13/17 0516  BP: (!) 161/96 (!) 165/108 (!) 166/100 (!) 149/97  Pulse: 73  65 (!) 56  Resp: 16  16 16   Temp: 98.5 F (36.9 C)  98.2 F (36.8 C) 98 F (36.7 C)  TempSrc: Oral  Oral Oral  SpO2: 99%  99% 100%  Weight:      Height:       General appearance: alert, cooperative, appears stated age and no distress Resp: clear to auscultation bilaterally Cardio: regular rate and rhythm, S1, S2 normal, no murmur, click, rub or gallop GI: soft, non-tender; bowel sounds normal; no masses,  no organomegaly  DISPOSITION: Home  Discharge Instructions    Call MD for:  difficulty breathing, headache or visual disturbances   Complete by:  As directed    Call MD for:  extreme fatigue   Complete by:  As directed    Call MD for:  persistant dizziness or light-headedness   Complete by:  As directed    Call MD for:  persistant nausea and vomiting    Complete by:  As directed    Call MD for:  severe uncontrolled pain   Complete by:  As directed    Call MD for:  temperature >100.4   Complete by:  As directed    Discharge instructions   Complete by:  As directed    Please be sure to follow-up with your primary care provider within 1 week.  You may need to be started on a new blood pressure medication since your metoprolol has been held for now.  Please check your blood pressures at home and maintain a record for your primary care provider.  Call gastroenterology if you develop symptoms of abdominal pain.  Soft diet for a few days and then back to usual diet.  Take MiraLAX daily for now.  If you develop multiple loose stools on a daily basis, then stop taking it.  You were cared for by a hospitalist during your hospital stay. If you  have any questions about your discharge medications or the care you received while you were in the hospital after you are discharged, you can call the unit and asked to speak with the hospitalist on call if the hospitalist that took care of you is not available. Once you are discharged, your primary care physician will handle any further medical issues. Please note that NO REFILLS for any discharge medications will be authorized once you are discharged, as it is imperative that you return to your primary care physician (or establish a relationship with a primary care physician if you do not have one) for your aftercare needs so that they can reassess your need for medications and monitor your lab values. If you do not have a primary care physician, you can call [phone] for a physician referral.   Increase activity slowly   Complete by:  As directed         Allergies as of 01/13/2017      Reactions   Lactose Nausea And Vomiting   Other    Pt states he is a Jehov[employer] Witness and accepts no blood products      Medication List    STOP taking these medications   metoprolol tartrate 100 MG tablet Commonly known as:   LOPRESSOR     TAKE these medications   amLODipine 10 MG tablet Commonly known as:  NORVASC Take 10 mg by mouth daily.   lisinopril-hydrochlorothiazide 20-12.5 MG tablet Commonly known as:  PRINZIDE,ZESTORETIC Take 1 tablet by mouth 2 (two) times daily.   pantoprazole 40 MG tablet Commonly known as:  PROTONIX Take 40 mg by mouth daily.   polyethylene glycol packet Commonly known as:  MIRALAX / GLYCOLAX Take 17 g by mouth daily.   potassium chloride SA 20 MEQ tablet Commonly known as:  K-DUR,KLOR-CON Take 20 mEq by mouth daily.        Follow-up Information    Charlott RakesSchooler, Vincent, MD Follow up.   Specialty:  Gastroenterology Why:  as needed Contact information: [address] [place] [address] [phone]           TOTAL DISCHARGE TIME: 35 minutes  Osvaldo ShipperGokul Shreena Baines  [facility] Pager [phone]  01/13/2017, 2:01 PM

## 2017-01-13 NOTE — Progress Notes (Signed)
Went over discharge papers with patient.  All questions answered.  AVS, prescriptions and work note given to patient.  Pt refused wheelchair and requested to walk to car himself, pt steady on feet and stable.

## 2017-01-15 DIAGNOSIS — I10 Essential (primary) hypertension: Secondary | ICD-10-CM | POA: Diagnosis not present

## 2017-01-15 DIAGNOSIS — E785 Hyperlipidemia, unspecified: Secondary | ICD-10-CM | POA: Diagnosis not present

## 2017-01-15 DIAGNOSIS — D509 Iron deficiency anemia, unspecified: Secondary | ICD-10-CM | POA: Diagnosis not present

## 2017-01-21 DIAGNOSIS — J343 Hypertrophy of nasal turbinates: Secondary | ICD-10-CM | POA: Diagnosis not present

## 2017-01-27 DIAGNOSIS — M2042 Other hammer toe(s) (acquired), left foot: Secondary | ICD-10-CM | POA: Diagnosis not present

## 2017-01-27 DIAGNOSIS — M79675 Pain in left toe(s): Secondary | ICD-10-CM | POA: Diagnosis not present

## 2017-01-27 DIAGNOSIS — S90212A Contusion of left great toe with damage to nail, initial encounter: Secondary | ICD-10-CM | POA: Diagnosis not present

## 2017-03-10 DIAGNOSIS — J343 Hypertrophy of nasal turbinates: Secondary | ICD-10-CM | POA: Diagnosis not present

## 2017-03-10 DIAGNOSIS — J3489 Other specified disorders of nose and nasal sinuses: Secondary | ICD-10-CM | POA: Diagnosis not present

## 2017-05-01 ENCOUNTER — Encounter (HOSPITAL_BASED_OUTPATIENT_CLINIC_OR_DEPARTMENT_OTHER): Payer: Self-pay | Admitting: *Deleted

## 2017-05-01 ENCOUNTER — Emergency Department (HOSPITAL_BASED_OUTPATIENT_CLINIC_OR_DEPARTMENT_OTHER)
Admission: EM | Admit: 2017-05-01 | Discharge: 2017-05-01 | Disposition: A | Discharge status: Home / Self Care | Payer: 59 | Attending: Emergency Medicine | Admitting: Emergency Medicine

## 2017-05-01 ENCOUNTER — Other Ambulatory Visit: Payer: Self-pay

## 2017-05-01 ENCOUNTER — Emergency Department (HOSPITAL_BASED_OUTPATIENT_CLINIC_OR_DEPARTMENT_OTHER): Payer: 59

## 2017-05-01 DIAGNOSIS — R0602 Shortness of breath: Secondary | ICD-10-CM | POA: Diagnosis not present

## 2017-05-01 DIAGNOSIS — R0789 Other chest pain: Secondary | ICD-10-CM | POA: Diagnosis not present

## 2017-05-01 DIAGNOSIS — R51 Headache: Secondary | ICD-10-CM | POA: Insufficient documentation

## 2017-05-01 DIAGNOSIS — F430 Acute stress reaction: Secondary | ICD-10-CM | POA: Insufficient documentation

## 2017-05-01 DIAGNOSIS — I10 Essential (primary) hypertension: Secondary | ICD-10-CM | POA: Diagnosis not present

## 2017-05-01 DIAGNOSIS — Z79899 Other long term (current) drug therapy: Secondary | ICD-10-CM | POA: Diagnosis not present

## 2017-05-01 DIAGNOSIS — F439 Reaction to severe stress, unspecified: Secondary | ICD-10-CM

## 2017-05-01 DIAGNOSIS — R079 Chest pain, unspecified: Secondary | ICD-10-CM | POA: Diagnosis not present

## 2017-05-01 LAB — BASIC METABOLIC PANEL
Anion gap: 8 (ref 5–15)
BUN: 13 mg/dL (ref 6–20)
CO2: 27 mmol/L (ref 22–32)
Calcium: 8.6 mg/dL — ABNORMAL LOW (ref 8.9–10.3)
Chloride: 103 mmol/L (ref 101–111)
Creatinine, Ser: 1.19 mg/dL (ref 0.61–1.24)
GFR calc Af Amer: >60 mL/min (ref >60)
GFR calc non Af Amer: >60 mL/min (ref >60)
Glucose, Bld: 98 mg/dL (ref 65–99)
Potassium: 2.9 mmol/L — ABNORMAL LOW (ref 3.5–5.1)
Sodium: 138 mmol/L (ref 135–145)

## 2017-05-01 LAB — CBC
HCT: 36 % — ABNORMAL LOW (ref 39.0–52.0)
Hemoglobin: 12.2 g/dL — ABNORMAL LOW (ref 13.0–17.0)
MCH: 22.4 pg — ABNORMAL LOW (ref 26.0–34.0)
MCHC: 33.9 g/dL (ref 30.0–36.0)
MCV: 66.1 fL — ABNORMAL LOW (ref 78.0–100.0)
Platelets: 189 10*3/uL (ref 150–400)
RBC: 5.45 MIL/uL (ref 4.22–5.81)
RDW: 15 % (ref 11.5–15.5)
WBC: 4.4 10*3/uL (ref 4.0–10.5)

## 2017-05-01 LAB — TROPONIN I
Troponin I: <0.03 ng/mL (ref <0.03)
Troponin I: <0.03 ng/mL (ref <0.03)

## 2017-05-01 MED ORDER — HYDROXYZINE HCL 25 MG PO TABS
25.0000 mg | ORAL_TABLET | Freq: Once | ORAL | Status: AC
  Administered 2017-05-01: 25 mg via ORAL
  Filled 2017-05-01: qty 1

## 2017-05-01 MED ORDER — POTASSIUM CHLORIDE CRYS ER 20 MEQ PO TBCR
40.0000 meq | EXTENDED_RELEASE_TABLET | Freq: Once | ORAL | Status: AC
  Administered 2017-05-01: 40 meq via ORAL
  Filled 2017-05-01: qty 2

## 2017-05-01 MED ORDER — HYDROXYZINE HCL 25 MG PO TABS: 25.0000 mg | ORAL_TABLET | Freq: Four times a day (QID) | ORAL | 0 refills | Status: DC | PRN

## 2017-05-01 NOTE — ED Triage Notes (Signed)
Chest pain x 2 days. Feels like someone is standing on his chest. States he gets this feeling every time he gets stressed out.

## 2017-05-01 NOTE — Discharge Instructions (Signed)
Evaluation today is reassuring, labs, chest x-ray and EKG do not show an acute heart or lung problem causing her chest pain.  It does sound like her under quite a bit of stress you may use hydroxyzine

## 2017-05-01 NOTE — ED Notes (Signed)
Pt. Reports he has had chest pain for 2 days due to stress of job and raising 37 year old alone.  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

## 2017-05-01 NOTE — ED Notes (Signed)
ED Provider at bedside. 

## 2017-05-01 NOTE — ED Notes (Signed)
Pt. Is resting with eyes closed  

## 2017-05-01 NOTE — ED Provider Notes (Signed)
[facility] EMERGENCY DEPARTMENT Provider Note   CSN: [id number] Arrival date & time: 05/01/17  1318     History   Chief Complaint Chief Complaint  Patient presents with  . Chest Pain    HPI Joel Perkins is a 37 y.o. male.  Joel Perkins is a 37 y.o. Male with a history of hypertension, presents to the ED for evaluation of 2 days of chest pain.  Patient reports over the past 2 days he has had several brief episodes of left-sided chest pain which he describes as a tightness and pressure.  Pain is not radiating, pain is not exertional or pleuritic.  Pain lasts for a few moments and then goes away but frequently comes back.  Nothing makes it better or worse.  He is not tried any medications to treat the symptoms.  He does report some associated shortness of breath, denies any lightheadedness or dizziness, no syncope.  Patient also reports some intermittent tension type headaches across his forehead.  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  Patient denies any fevers or chills, no cough, no nausea, vomiting, no abdominal pain.  Patient denies any recent long distance travel or surgeries, no lower extremity swelling or pain, no history of PE or DVT.     Past Medical History:  Diagnosis Date  . Hypertension     Patient Active Problem List   Diagnosis Date Noted  . Foreign body in small intestine 01/09/2017  . HTN (hypertension) 01/09/2017    Past Surgical History:  Procedure  Laterality Date  . SINUS SURGERY WITH INSTATRAK    . TONSILLECTOMY          Home Medications    Prior to Admission medications   Medication Sig Start Date End Date Taking? Authorizing Provider  amLODipine (NORVASC) 10 MG tablet Take 10 mg by mouth daily. 11/14/16  Yes [provider]  lisinopril-hydrochlorothiazide (PRINZIDE,ZESTORETIC) 20-12.5 MG tablet Take 1 tablet by mouth 2 (two) times daily. 11/14/16  Yes [provider]  pantoprazole (PROTONIX) 40 MG tablet Take 40 mg by mouth daily. 10/22/16  Yes [provider]  polyethylene glycol (MIRALAX / GLYCOLAX) packet Take 17 g by mouth daily. 01/13/17  Yes Osvaldo Shipper, MD  potassium chloride SA (K-DUR,KLOR-CON) 20 MEQ tablet Take 20 mEq by mouth daily. 11/14/16  Yes [provider]    Family History No family history on file.  Social History Social History   Tobacco Use  . Smoking status: Never Smoker  . Smokeless tobacco: Never Used  Substance Use Topics  . Alcohol use: Yes    Frequency: Never  . Drug use: No     Allergies   Lactose and Other   Review of Systems Review of Systems  Constitutional: Negative for chills and fever.  HENT: Negative for congestion, postnasal drip and sore throat.   Eyes: Negative for visual disturbance.  Respiratory: Positive for chest tightness and shortness of breath. Negative for cough and wheezing.  Cardiovascular: Positive for chest pain. Negative for palpitations and leg swelling.  Gastrointestinal: Negative for abdominal pain, diarrhea, nausea and vomiting.  Genitourinary: Negative for dysuria and frequency.  Musculoskeletal: Negative for arthralgias and myalgias.  Skin: Negative for color change and rash.  Neurological: Negative for dizziness, syncope, weakness, light-headedness and numbness.  Psychiatric/Behavioral: The patient is nervous/anxious.      Physical Exam Updated Vital Signs BP (!) 155/93 (BP Location: Left Arm)   Pulse 64    Temp 97.8 F (36.6 C) (Oral)   Resp 18   Ht 5\' 8"  (1.727 m)   Wt 93 kg (205 lb)   SpO2 100%   BMI 31.17 kg/m   Physical Exam  Constitutional: He appears well-developed and well-nourished. No distress.  Pt appears anxious, but is in NAD  HENT:  Head: Normocephalic and atraumatic.  Mouth/Throat: Oropharynx is clear and moist.  Eyes: Pupils are equal, round, and reactive to light. EOM are normal. Right eye exhibits no discharge. Left eye exhibits no discharge.  Neck: Neck supple. No JVD present. No tracheal deviation present.  Cardiovascular: Normal rate, regular rhythm, normal heart sounds and intact distal pulses.  Pulses:      Radial pulses are 2+ on the right side, and 2+ on the left side.       Dorsalis pedis pulses are 2+ on the right side, and 2+ on the left side.  Pulmonary/Chest: Effort normal and breath sounds normal. No stridor. No respiratory distress. He has no wheezes. He has no rales.  Respirations equal and unlabored, patient able to speak in full sentences, lungs clear to auscultation bilaterally  Abdominal: Soft. Bowel sounds are normal. He exhibits no distension and no mass. There is no tenderness. There is no rebound and no guarding.  Musculoskeletal: He exhibits no edema or deformity.  Bilateral lower extremities w/o edema or tenderness  Neurological: He is alert. Coordination normal.  Skin: Skin is warm and dry. Capillary refill takes less than 2 seconds. He is not diaphoretic.  Psychiatric: He has a normal mood and affect. His behavior is normal.  Nursing note and vitals reviewed.    ED Treatments / Results  Labs (all labs ordered are listed, but only abnormal results are displayed) Labs Reviewed  BASIC METABOLIC PANEL - Abnormal; Notable for the following components:      Result Value   Potassium 2.9 (*)    Calcium 8.6 (*)    All other components within normal limits  CBC - Abnormal; Notable for the following components:   Hemoglobin 12.2 (*)    HCT  36.0 (*)    MCV 66.1 (*)    MCH 22.4 (*)    All other components within normal limits  TROPONIN I    EKG ED ECG REPORT   Date: 05/01/2017  Rate: 73  Rhythm: normal sinus rhythm  QRS Axis: normal  Intervals: normal  ST/T Wave abnormalities: nonspecific ST/T changes  Conduction Disutrbances:none  Narrative Interpretation:   Old EKG Reviewed: none available  I have personally reviewed the EKG tracing and agree with the computerized printout as noted.   Radiology Dg Chest 2 View  Result Date: 05/01/2017 CLINICAL DATA:  Chest pain. EXAM: CHEST - 2 VIEW COMPARISON:  12/16/2015 FINDINGS: The heart size and mediastinal contours are within normal limits. Both lungs are clear. The visualized skeletal structures are unremarkable. IMPRESSION: Normal exam. Electronically Signed   By: Francene BoyersJames  Maxwell M.D.   On: 05/01/2017 15:34    Procedures Procedures (including critical care  time)  Medications Ordered in ED Medications  potassium chloride SA (K-DUR,KLOR-CON) CR tablet 40 mEq (has no administration in time range)  hydrOXYzine (ATARAX/VISTARIL) tablet 25 mg (25 mg Oral Given 05/01/17 1447)     Initial Impression / Assessment and Plan / ED Course  I have reviewed the triage vital signs and the nursing notes.  Pertinent labs & imaging results that were available during my care of the patient were reviewed by me and considered in my medical decision making (see chart for details).  Pt presents for evaluation of several brief episodes of chest pain with some associated shortness of breath.  Patient reports he has been under very large amounts of stress recently and has had the symptoms in the past when he becomes very stressed.  Chest pain is not likely of cardiac or pulmonary etiology d/t presentation, PERC negative, VSS, no tracheal deviation, no JVD or new murmur, RRR, breath sounds equal bilaterally, EKG without nonspecific ST changes, but no acute abnormalities, two negative Troponins  based on HEART Pathway score of 2, and negative CXR.   Patient's blood pressure is elevated here today he has been in the process of adjusting his blood pressure medications with his primary care doctor.  He was hypokalemic today this is likely in the setting of renal losses due to increasing his blood pressure medication.  Will replace with 40 mEq of potassium here in the ED and have patient continue taking his daily home potassium.  No other acute electrolyte derangements and no leukocytosis with normal hemoglobin.  Patient is to be discharged with recommendation to follow up with PCP in regards to today's hospital visit, pt has also been provided with outpt counseling resource given the large amount of stress he is dealing with. Vistaril helped with symptomsPt has been advised to return to the ED if CP becomes exertional, associated with diaphoresis or nausea, radiates to left jaw/arm, worsens or becomes concerning in any way. Pt appears reliable for follow up and is agreeable to discharge.    Final Clinical Impressions(s) / ED Diagnoses   Final diagnoses:  Atypical chest pain  Stress    ED Discharge Orders        Ordered    hydrOXYzine (ATARAX/VISTARIL) 25 MG tablet  Every 6 hours PRN     05/01/17 1830       Dartha Lodge, [place] 05/02/17 1425    Tegeler, Canary Brim, MD 05/02/17 2350

## 2017-06-10 DIAGNOSIS — E785 Hyperlipidemia, unspecified: Secondary | ICD-10-CM | POA: Diagnosis not present

## 2017-06-10 DIAGNOSIS — I10 Essential (primary) hypertension: Secondary | ICD-10-CM | POA: Diagnosis not present

## 2017-06-10 DIAGNOSIS — D509 Iron deficiency anemia, unspecified: Secondary | ICD-10-CM | POA: Diagnosis not present

## 2017-06-26 ENCOUNTER — Ambulatory Visit (HOSPITAL_COMMUNITY)
Admission: EM | Admit: 2017-06-26 | Discharge: 2017-06-26 | Disposition: A | Discharge status: Home / Self Care | Payer: 59 | Attending: Family Medicine | Admitting: Family Medicine

## 2017-06-26 ENCOUNTER — Encounter (HOSPITAL_COMMUNITY): Payer: Self-pay | Admitting: Family Medicine

## 2017-06-26 DIAGNOSIS — H00015 Hordeolum externum left lower eyelid: Secondary | ICD-10-CM

## 2017-06-26 MED ORDER — CEPHALEXIN 500 MG PO CAPS: 500.0000 mg | ORAL_CAPSULE | Freq: Four times a day (QID) | ORAL | 0 refills | Status: AC

## 2017-06-26 NOTE — ED Triage Notes (Addendum)
Pt here for left lower eyelid redness and swelling.

## 2017-06-26 NOTE — Discharge Instructions (Addendum)
Stye  -Warm compresses multiple times a day with massage afterward  - Do not wear contacts or make up  - Keep lid clean, may use diluted baby shampoo  - Keflex  - Return if increasing, redness, pain or swelling, decreased vision.

## 2017-06-26 NOTE — ED Provider Notes (Signed)
[facility]    CSN: [id number] Arrival date & time: 06/26/17  1628     History   Chief Complaint Chief Complaint  Patient presents with  . Eye Problem    HPI Joel Perkins is a 37 y.o. male history of hypertension presenting today for evaluation of left eye swelling.  Patient states that for the past 3 days he has had swelling and redness to his left lower eyelid.  Also associated with itching.  He notes that he recently went to [place] and believes he may have touched something and rubbed his eye.  He denies wearing contacts or glasses.  Denies changes in vision, does have pain when looking downward.  He has been using Visine drops without relief as well as trying warm compresses.  HPI  Past Medical History:  Diagnosis Date  . Hypertension     Patient Active Problem List   Diagnosis Date Noted  . Foreign body in small intestine 01/09/2017  . HTN (hypertension) 01/09/2017    Past Surgical History:  Procedure Laterality Date  . SINUS SURGERY WITH INSTATRAK    . TONSILLECTOMY         Home Medications    Prior to Admission medications   Medication Sig Start Date End Date Taking? Authorizing Provider  amLODipine (NORVASC) 10 MG tablet Take 10 mg by mouth daily. 11/14/16   [provider]  cephALEXin (KEFLEX) 500 MG capsule Take 1 capsule (500 mg total) by mouth 4 (four) times daily for 7 days. 06/26/17 07/03/17  Wieters, Hallie C, PA-C  hydrOXYzine (ATARAX/VISTARIL) 25 MG tablet Take 1 tablet (25 mg total) by mouth every 6 (six) hours as needed for anxiety. 05/01/17   Dartha Lodge, PA-C  lisinopril-hydrochlorothiazide (PRINZIDE,ZESTORETIC) 20-12.5 MG tablet Take 1 tablet by mouth 2 (two) times daily. 11/14/16   [provider]  pantoprazole (PROTONIX) 40 MG tablet Take 40 mg by mouth daily. 10/22/16   [provider]  polyethylene glycol (MIRALAX / GLYCOLAX) packet Take 17 g by mouth daily. 01/13/17   Osvaldo Shipper, MD  potassium  chloride SA (K-DUR,KLOR-CON) 20 MEQ tablet Take 20 mEq by mouth daily. 11/14/16   [provider]    Family History History reviewed. No pertinent family history.  Social History Social History   Tobacco Use  . Smoking status: Never Smoker  . Smokeless tobacco: Never Used  Substance Use Topics  . Alcohol use: Yes    Frequency: Never  . Drug use: No     Allergies   Lactose and Other   Review of Systems Review of Systems  Constitutional: Negative for activity change, appetite change, fatigue and fever.  HENT: Negative for congestion, ear pain, rhinorrhea and sore throat.   Eyes: Positive for discharge, redness and itching. Negative for photophobia, pain and visual disturbance.  Respiratory: Negative for cough and shortness of breath.   Cardiovascular: Negative for chest pain.  Gastrointestinal: Negative for abdominal pain, diarrhea, nausea and vomiting.  Musculoskeletal: Negative for myalgias.  Skin: Negative for color change, rash and wound.  Neurological: Negative for dizziness, weakness, light-headedness, numbness and headaches.     Physical Exam Triage Vital Signs ED Triage Vitals [06/26/17 1642]  Enc Vitals Group     BP (!) 165/96     Pulse Rate 63     Resp 18     Temp 98.2 F (36.8 C)     Temp src      SpO2 100 %     Weight  Height      Head Circumference      Peak Flow      Pain Score      Pain Loc      Pain Edu?      Excl. in GC?    No data found.  Updated Vital Signs BP (!) 165/96   Pulse 63   Temp 98.2 F (36.8 C)   Resp 18   SpO2 100%   Visual Acuity Right Eye Distance: 20/20 Left Eye Distance: 20/20 Bilateral Distance: 20/20  Right Eye Near:   Left Eye Near:    Bilateral Near:     Physical Exam  Constitutional: He appears well-developed and well-nourished.  HENT:  Head: Normocephalic and atraumatic.  Mouth/Throat: Oropharynx is clear and moist.  Eyes: Pupils are equal, round, and reactive to light. Conjunctivae and  EOM are normal.  Swelling diffusely across left lower eyelid, watery drainage present from eye, tenderness to palpation  Neck: Neck supple.  Cardiovascular: Normal rate and regular rhythm.  No murmur heard. Pulmonary/Chest: Effort normal and breath sounds normal. No respiratory distress.  Abdominal: Soft. There is no tenderness.  Musculoskeletal: He exhibits no edema.  Neurological: He is alert.  Skin: Skin is warm and dry.  Psychiatric: He has a normal mood and affect.  Nursing note and vitals reviewed.    UC Treatments / Results  Labs (all labs ordered are listed, but only abnormal results are displayed) Labs Reviewed - No data to display  EKG None  Radiology No results found.  Procedures Procedures (including critical care time)  Medications Ordered in UC Medications - No data to display  Initial Impression / Assessment and Plan / UC Course  I have reviewed the triage vital signs and the nursing notes.  Pertinent labs & imaging results that were available during my care of the patient were reviewed by me and considered in my medical decision making (see chart for details).     Patient with what appears to be a hordeolum/stye.  Will have apply warm compresses with massage, provided Keflex to begin taking.  Discussed lid scrubs with dilated baby shampoo.  No change in visual acuity, no red flags.  Will have follow-up here with ophthalmology if symptoms not improving or worsening.  Go to emergency room if developing changes in vision, eye pain. Final Clinical Impressions(s) / UC Diagnoses   Final diagnoses:  Hordeolum of left lower eyelid, unspecified hordeolum type     Discharge Instructions     Stye  -Warm compresses multiple times a day with massage afterward  - Do not wear contacts or make up  - Keep lid clean, may use diluted baby shampoo  - Keflex  - Return if increasing, redness, pain or swelling, decreased vision.     ED Prescriptions    Medication  Sig Dispense Auth. Provider   cephALEXin (KEFLEX) 500 MG capsule Take 1 capsule (500 mg total) by mouth 4 (four) times daily for 7 days. 28 capsule Wieters, Hallie C, PA-C     Controlled Substance Prescriptions [place] Controlled Substance Registry consulted? Not Applicable   Lew DawesWieters, Hallie C, [place]PA-C 06/26/17 1702

## 2017-07-13 DIAGNOSIS — I10 Essential (primary) hypertension: Secondary | ICD-10-CM | POA: Diagnosis not present

## 2017-07-13 DIAGNOSIS — D509 Iron deficiency anemia, unspecified: Secondary | ICD-10-CM | POA: Diagnosis not present

## 2017-07-13 DIAGNOSIS — E785 Hyperlipidemia, unspecified: Secondary | ICD-10-CM | POA: Diagnosis not present

## 2017-09-24 ENCOUNTER — Encounter (HOSPITAL_COMMUNITY): Payer: Self-pay

## 2017-09-24 ENCOUNTER — Ambulatory Visit (HOSPITAL_COMMUNITY)
Admission: EM | Admit: 2017-09-24 | Discharge: 2017-09-24 | Disposition: A | Discharge status: Home / Self Care | Payer: 59 | Attending: Family Medicine | Admitting: Family Medicine

## 2017-09-24 DIAGNOSIS — K529 Noninfective gastroenteritis and colitis, unspecified: Secondary | ICD-10-CM

## 2017-09-24 MED ORDER — ONDANSETRON HCL 4 MG PO TABS: 4.0000 mg | ORAL_TABLET | Freq: Four times a day (QID) | ORAL | 0 refills | Status: DC

## 2017-09-24 MED ORDER — ONDANSETRON 4 MG PO TBDP
4.0000 mg | ORAL_TABLET | Freq: Once | ORAL | Status: AC
  Administered 2017-09-24: 4 mg via ORAL

## 2017-09-24 MED ORDER — ONDANSETRON 4 MG PO TBDP
ORAL_TABLET | ORAL | Status: AC
  Filled 2017-09-24: qty 1

## 2017-09-24 NOTE — ED Provider Notes (Signed)
[facility]    CSN: [id number] Arrival date & time: 09/24/17  4098     History   Chief Complaint Chief Complaint  Patient presents with  . Diarrhea    HPI Joel Perkins is a 37 y.o. male.   She is a 37 year old male with past medical history of hypertension.  He presents with 2 days of diarrhea, nausea, vomiting.  The diarrhea has been soft and watery at times.  He has had about 6 episodes of diarrhea.  He has vomited 3 times, non bilious.  The nausea is constant.  He is having abdominal cramping.  He denies any recent sick contacts, fever, chills, body aches.  He reports this started after eating some [place] food 2 days ago.  He has been sipping water but no appetite.  ROS per HPI      Past Medical History:  Diagnosis Date  . Hypertension     Patient Active Problem List   Diagnosis Date Noted  . Foreign body in small intestine 01/09/2017  . HTN (hypertension) 01/09/2017    Past Surgical History:  Procedure Laterality Date  . SINUS SURGERY WITH INSTATRAK    . TONSILLECTOMY         Home Medications    Prior to Admission medications   Medication Sig Start Date End Date Taking? Authorizing Provider  amLODipine (NORVASC) 10 MG tablet Take 10 mg by mouth daily. 11/14/16   [provider]  hydrOXYzine (ATARAX/VISTARIL) 25 MG tablet Take 1 tablet (25 mg total) by mouth every 6 (six) hours as needed for anxiety. 05/01/17   Dartha Lodge, PA-C  lisinopril-hydrochlorothiazide (PRINZIDE,ZESTORETIC) 20-12.5 MG tablet Take 1 tablet by mouth 2 (two) times daily. 11/14/16   [provider]  ondansetron (ZOFRAN) 4 MG tablet Take 1 tablet (4 mg total) by mouth every 6 (six) hours. 09/24/17   Keydi Giel, Gloris Manchester A, NP  pantoprazole (PROTONIX) 40 MG tablet Take 40 mg by mouth daily. 10/22/16   [provider]  polyethylene glycol (MIRALAX / GLYCOLAX) packet Take 17 g by mouth daily. 01/13/17   Osvaldo Shipper, MD  potassium chloride SA (K-DUR,KLOR-CON)  20 MEQ tablet Take 20 mEq by mouth daily. 11/14/16   [provider]    Family History History reviewed. No pertinent family history.  Social History Social History   Tobacco Use  . Smoking status: Never Smoker  . Smokeless tobacco: Never Used  Substance Use Topics  . Alcohol use: Yes    Frequency: Never  . Drug use: No     Allergies   Lactose and Other   Review of Systems Review of Systems   Physical Exam Triage Vital Signs ED Triage Vitals [09/24/17 1003]  Enc Vitals Group     BP (!) 175/114     Pulse Rate 68     Resp 16     Temp 98.5 F (36.9 C)     Temp Source Oral     SpO2 100 %     Weight      Height      Head Circumference      Peak Flow      Pain Score      Pain Loc      Pain Edu?      Excl. in GC?    No data found.  Updated Vital Signs BP (!) 175/114 (BP Location: Right Arm)   Pulse 68   Temp 98.5 F (36.9 C) (Oral)   Resp 16   SpO2  100%   Visual Acuity Right Eye Distance:   Left Eye Distance:   Bilateral Distance:    Right Eye Near:   Left Eye Near:    Bilateral Near:     Physical Exam  Constitutional: He is oriented to person, place, and time. He appears well-nourished.  Very pleasant. Non toxic or ill appearing.     HENT:  Head: Normocephalic and atraumatic.  Eyes: Conjunctivae are normal.  Neck: Normal range of motion.  Pulmonary/Chest: Effort normal.  Abdominal: Soft. Bowel sounds are normal.  Abdomen soft, non tender. No CVA tenderness. No rebound tenderness.     Musculoskeletal: Normal range of motion.  Neurological: He is alert and oriented to person, place, and time.  Skin: Skin is warm and dry. Capillary refill takes less than 2 seconds.  Psychiatric: He has a normal mood and affect.  Nursing note and vitals reviewed.    UC Treatments / Results  Labs (all labs ordered are listed, but only abnormal results are displayed) Labs Reviewed - No data to display  EKG None  Radiology No results  found.  Procedures Procedures (including critical care time)  Medications Ordered in UC Medications  ondansetron (ZOFRAN-ODT) disintegrating tablet 4 mg (4 mg Oral Given 09/24/17 1039)    Initial Impression / Assessment and Plan / UC Course  I have reviewed the triage vital signs and the nursing notes.  Pertinent labs & imaging results that were available during my care of the patient were reviewed by me and considered in my medical decision making (see chart for details).     Likely viral gastroenteritis or food poisoning. Zofran for nausea vomiting. Work note given. Instructions and precautions given. Final Clinical Impressions(s) / UC Diagnoses   Final diagnoses:  Gastroenteritis     Discharge Instructions     It was nice meeting you!!  We will give you some Zofran to treat the nausea vomiting.  I believe this is a virus or reaction to the food she ate. Try to stay hydrated with water and Gatorade. Advance diet as tolerated. Follow up as needed for continued or worsening symptoms     ED Prescriptions    Medication Sig Dispense Auth. Provider   ondansetron (ZOFRAN) 4 MG tablet Take 1 tablet (4 mg total) by mouth every 6 (six) hours. 12 tablet Dahlia ByesBast, Clair Bardwell A, NP     Controlled Substance Prescriptions [place] Controlled Substance Registry consulted? Not Applicable   Janace ArisBast, Kendy Haston A, NP 09/24/17 1202

## 2017-09-24 NOTE — Discharge Instructions (Addendum)
It was nice meeting you!!  We will give you some Zofran to treat the nausea vomiting.  I believe this is a virus or reaction to the food she ate. Try to stay hydrated with water and Gatorade. Advance diet as tolerated. Follow up as needed for continued or worsening symptoms

## 2017-09-24 NOTE — ED Triage Notes (Signed)
Pt presents with ongoing diarrhea X2 days that has been unrelieved, also complains of abdominal cramping .

## 2017-10-21 DIAGNOSIS — D509 Iron deficiency anemia, unspecified: Secondary | ICD-10-CM | POA: Diagnosis not present

## 2017-10-21 DIAGNOSIS — I10 Essential (primary) hypertension: Secondary | ICD-10-CM | POA: Diagnosis not present

## 2017-10-21 DIAGNOSIS — E785 Hyperlipidemia, unspecified: Secondary | ICD-10-CM | POA: Diagnosis not present

## 2017-11-10 DIAGNOSIS — D509 Iron deficiency anemia, unspecified: Secondary | ICD-10-CM | POA: Diagnosis not present

## 2017-11-10 DIAGNOSIS — E7800 Pure hypercholesterolemia, unspecified: Secondary | ICD-10-CM | POA: Diagnosis not present

## 2017-11-10 DIAGNOSIS — E785 Hyperlipidemia, unspecified: Secondary | ICD-10-CM | POA: Diagnosis not present

## 2017-11-10 DIAGNOSIS — E039 Hypothyroidism, unspecified: Secondary | ICD-10-CM | POA: Diagnosis not present

## 2017-11-10 DIAGNOSIS — I10 Essential (primary) hypertension: Secondary | ICD-10-CM | POA: Diagnosis not present

## 2017-11-10 DIAGNOSIS — E119 Type 2 diabetes mellitus without complications: Secondary | ICD-10-CM | POA: Diagnosis not present

## 2018-01-04 ENCOUNTER — Other Ambulatory Visit: Payer: Self-pay

## 2018-01-04 ENCOUNTER — Emergency Department (HOSPITAL_BASED_OUTPATIENT_CLINIC_OR_DEPARTMENT_OTHER)
Admission: EM | Admit: 2018-01-04 | Discharge: 2018-01-04 | Disposition: A | Discharge status: Home / Self Care | Payer: No Typology Code available for payment source | Attending: Emergency Medicine | Admitting: Emergency Medicine

## 2018-01-04 ENCOUNTER — Encounter (HOSPITAL_BASED_OUTPATIENT_CLINIC_OR_DEPARTMENT_OTHER): Payer: Self-pay

## 2018-01-04 ENCOUNTER — Emergency Department (HOSPITAL_BASED_OUTPATIENT_CLINIC_OR_DEPARTMENT_OTHER): Payer: No Typology Code available for payment source

## 2018-01-04 DIAGNOSIS — S60222A Contusion of left hand, initial encounter: Secondary | ICD-10-CM | POA: Insufficient documentation

## 2018-01-04 DIAGNOSIS — Y990 Civilian activity done for income or pay: Secondary | ICD-10-CM | POA: Diagnosis not present

## 2018-01-04 DIAGNOSIS — Y92410 Unspecified street and highway as the place of occurrence of the external cause: Secondary | ICD-10-CM | POA: Insufficient documentation

## 2018-01-04 DIAGNOSIS — Z79899 Other long term (current) drug therapy: Secondary | ICD-10-CM | POA: Diagnosis not present

## 2018-01-04 DIAGNOSIS — I10 Essential (primary) hypertension: Secondary | ICD-10-CM | POA: Insufficient documentation

## 2018-01-04 DIAGNOSIS — S6992XA Unspecified injury of left wrist, hand and finger(s), initial encounter: Secondary | ICD-10-CM | POA: Diagnosis present

## 2018-01-04 DIAGNOSIS — S0083XA Contusion of other part of head, initial encounter: Secondary | ICD-10-CM | POA: Insufficient documentation

## 2018-01-04 DIAGNOSIS — Y9389 Activity, other specified: Secondary | ICD-10-CM | POA: Diagnosis not present
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
## 2018-01-04 NOTE — Discharge Instructions (Signed)
Ice and elevate.  Wear the splint at all times except when showering
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
## 2018-01-11 ENCOUNTER — Ambulatory Visit (INDEPENDENT_AMBULATORY_CARE_PROVIDER_SITE_OTHER): Payer: No Typology Code available for payment source | Admitting: Family Medicine

## 2018-01-11 ENCOUNTER — Encounter: Payer: Self-pay | Admitting: Family Medicine

## 2018-01-11 VITALS — BP 178/107 | HR 68 | Ht 68.0 in | Wt 203.0 lb

## 2018-01-11 DIAGNOSIS — S6992XA Unspecified injury of left wrist, hand and finger(s), initial encounter: Secondary | ICD-10-CM | POA: Diagnosis not present

## 2018-01-11 MED ORDER — TRAMADOL HCL 50 MG PO TABS: 50.0000 mg | ORAL_TABLET | Freq: Four times a day (QID) | ORAL | 0 refills | Status: DC | PRN

## 2018-01-11 NOTE — Patient Instructions (Signed)
You have a severe thumb sprain and contusion. Ice the area 15 minutes at a time 3-4 times a day. Elevate above your heart as much as possible. Thumb spica brace at all times except to wash the area and ice it. Tylenol 500mg  1-2 tabs three times a day as needed for pain. Aleve 1-2 tabs twice a day with food - this is for pain and inflammation but can increase your blood pressure. Topical aspercreme up to 4 times a day. Tramadol as needed for severe pain (no driving or working on this medication). Follow up with me in 3 weeks for reevaluation.

## 2018-01-12 ENCOUNTER — Encounter: Payer: Self-pay | Admitting: Family Medicine
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
## 2018-01-20 DIAGNOSIS — R0681 Apnea, not elsewhere classified: Secondary | ICD-10-CM | POA: Diagnosis not present

## 2018-02-03 ENCOUNTER — Encounter: Payer: Self-pay | Admitting: Family Medicine

## 2018-02-03 ENCOUNTER — Ambulatory Visit (INDEPENDENT_AMBULATORY_CARE_PROVIDER_SITE_OTHER): Payer: 59 | Admitting: Family Medicine

## 2018-02-03 VITALS — BP 186/106 | HR 58 | Ht 68.0 in | Wt 198.0 lb

## 2018-02-03 DIAGNOSIS — S6992XD Unspecified injury of left wrist, hand and finger(s), subsequent encounter: Secondary | ICD-10-CM

## 2018-02-03 NOTE — Addendum Note (Signed)
Addended by: Kathi Simpers F on: 02/03/2018 03:41 PM   Modules accepted: Orders
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
## 2018-02-03 NOTE — Patient Instructions (Signed)
We will go ahead with an MRI of your thumb to assess for UCL, RCL injuries to the MCP joint as you should be more improved than you are at this point. Ice the area 15 minutes at a time 3-4 times a day. Elevate above your heart as much as possible. Thumb spica brace at all times except to wash the area and ice it. Tylenol 500mg  1-2 tabs three times a day as needed for pain. Aleve 1-2 tabs twice a day with food - this is for pain and inflammation but can increase your blood pressure. Topical aspercreme up to 4 times a day. Follow up will depend on the MRI.

## 2018-02-18 ENCOUNTER — Other Ambulatory Visit: Payer: Self-pay

## 2018-02-22 ENCOUNTER — Other Ambulatory Visit: Payer: Self-pay

## 2018-03-10 ENCOUNTER — Ambulatory Visit (HOSPITAL_COMMUNITY)
Admission: EM | Admit: 2018-03-10 | Discharge: 2018-03-10 | Disposition: A | Discharge status: Home / Self Care | Payer: 59 | Attending: Family Medicine | Admitting: Family Medicine

## 2018-03-10 ENCOUNTER — Encounter (HOSPITAL_COMMUNITY): Payer: Self-pay | Admitting: Emergency Medicine

## 2018-03-10 ENCOUNTER — Other Ambulatory Visit: Payer: Self-pay

## 2018-03-10 DIAGNOSIS — R0789 Other chest pain: Secondary | ICD-10-CM

## 2018-03-10 MED ORDER — NAPROXEN 500 MG PO TABS: 500.0000 mg | ORAL_TABLET | Freq: Two times a day (BID) | ORAL | 0 refills | Status: AC

## 2018-03-10 NOTE — ED Triage Notes (Signed)
Pt reports new onset left sided chest pain that is intermittent that began last night.  He states it is a squeezing pain that lasts about 1 minute and it is associated with dizziness, weakness, and SOB.  Pt states he has had 4 episodes since last night.  Pt states that now, when he is breathing he feels like his whole left side of his chest is tight.  Pt also reports a severe headache last night as well.  He does report being under a lot of stress lately.  The pain was reproducible on his medially on the left side of his chest.

## 2018-03-10 NOTE — Discharge Instructions (Addendum)
Use anti-inflammatories for pain/swelling. You may take up to 800 mg Ibuprofen every 8 hours with food. You may supplement Ibuprofen with Tylenol [phone] mg every 8 hours.  OR Naprosyn twice daily with food  IF pain worsening or not improving please go to emergency room

## 2018-03-10 NOTE — ED Provider Notes (Addendum)
[facility]    CSN: [id number] Arrival date & time: 03/10/18  1535     History   Chief Complaint Chief Complaint  Patient presents with  . Chest Pain    HPI Joel Perkins is a 38 y.o. male history of hypertension, hyperlipidemia presenting today for evaluation of chest discomfort.  Patient states that beginning last night he has had a squeezing sensation in his left chest.  Symptoms would last for approximately 1 minute.  This would happen when he was at rest and not exerting himself.  Today he has had one episode of this.  Since last night he is continued to have a tightness in the left side of his chest as well mainly noting with breathing.  Denies associated nausea or vomiting.  Denies cough.  Denies leg pain or leg swelling.  Denies previous DVT/PE.  Denies recent travel or immobilization.  Denies tobacco use.  Denies history of diabetes.  HPI  Past Medical History:  Diagnosis Date  . Hypertension     Patient Active Problem List   Diagnosis Date Noted  . Foreign body in small intestine 01/09/2017  . HTN (hypertension) 01/09/2017    Past Surgical History:  Procedure Laterality Date  . SINUS SURGERY WITH INSTATRAK    . TONSILLECTOMY         Home Medications    Prior to Admission medications   Medication Sig Start Date End Date Taking? Authorizing Provider  amLODipine (NORVASC) 10 MG tablet Take 10 mg by mouth daily. 11/14/16  Yes [provider]  lisinopril-hydrochlorothiazide (PRINZIDE,ZESTORETIC) 20-12.5 MG tablet Take 1 tablet by mouth 2 (two) times daily. 11/14/16  Yes [provider]  ARIPiprazole (ABILIFY) 10 MG tablet  01/25/18   [provider]  fluticasone (FLONASE) 50 MCG/ACT nasal spray SHAKE LQ AND U 1 TO 2 SPRAYS IEN QD 01/03/18   [provider]  hydrOXYzine (ATARAX/VISTARIL) 50 MG tablet  01/25/18   [provider]  metoprolol tartrate (LOPRESSOR) 100 MG tablet TK 1 T PO BID WF 01/03/18   [provider]  naproxen (NAPROSYN) 500 MG tablet Take 1 tablet (500 mg total) by mouth 2 (two) times daily. 03/10/18   ,  C, PA-C  TRINTELLIX 10 MG TABS tablet  01/25/18   [provider]    Family History History reviewed. No pertinent family history.  Social History Social History   Tobacco Use  . Smoking status: Never Smoker  . Smokeless tobacco: Never Used  Substance Use Topics  . Alcohol use: Yes    Frequency: Never    Comment: occ  . Drug use: No     Allergies   Corn-containing products; Lactose; and Other   Review of Systems Review of Systems  Constitutional: Negative for fatigue and fever.  HENT: Negative for congestion, sinus pressure and sore throat.   Eyes: Negative for photophobia, pain and visual disturbance.  Respiratory: Negative for cough and shortness of breath.   Cardiovascular: Positive for chest pain.  Gastrointestinal: Negative for abdominal pain, nausea and vomiting.  Genitourinary: Negative for decreased urine volume and hematuria.  Musculoskeletal: Negative for myalgias, neck pain and neck stiffness.  Neurological: Positive for headaches. Negative for dizziness, syncope, facial asymmetry, speech difficulty, weakness, light-headedness and numbness.     Physical Exam Triage Vital Signs ED Triage Vitals  Enc Vitals Group     BP 03/10/18 1613 (!) 154/94     Pulse Rate 03/10/18 1613 70     Resp --  Temp 03/10/18 1613 98.5 F (36.9 C)     Temp src --      SpO2 03/10/18 1613 97 %     Weight --      Height --      Head Circumference --      Peak Flow --      Pain Score 03/10/18 1609 6     Pain Loc --      Pain Edu? --      Excl. in GC? --    No data found.  Updated Vital Signs BP (!) 154/94 (BP Location: Right Arm)   Pulse 70   Temp 98.5 F (36.9 C)   SpO2 97%   Visual Acuity Right Eye Distance:   Left Eye Distance:   Bilateral Distance:    Right Eye Near:   Left Eye Near:    Bilateral Near:      Physical Exam Vitals signs and nursing note reviewed.  Constitutional:      Appearance: He is well-developed.  HENT:     Head: Normocephalic and atraumatic.  Eyes:     Extraocular Movements: Extraocular movements intact.     Conjunctiva/sclera: Conjunctivae normal.     Pupils: Pupils are equal, round, and reactive to light.  Neck:     Musculoskeletal: Neck supple.  Cardiovascular:     Rate and Rhythm: Normal rate and regular rhythm.     Heart sounds: No murmur.  Pulmonary:     Effort: Pulmonary effort is normal. No respiratory distress.     Breath sounds: Normal breath sounds.     Comments: Left anterior chest with tenderness to palpation-patient states that this is the same pain he has been feeling with breathing as well as squeezing sensation; nontender on right side Chest:     Chest wall: Tenderness present.  Abdominal:     Palpations: Abdomen is soft.     Tenderness: There is no abdominal tenderness.  Musculoskeletal:     Comments: No lower extremity swelling or calf tenderness  Skin:    General: Skin is warm and dry.  Neurological:     Mental Status: He is alert.      UC Treatments / Results  Labs (all labs ordered are listed, but only abnormal results are displayed) Labs Reviewed - No data to display  EKG None  Radiology No results found.  Procedures Procedures (including critical care time)  Medications Ordered in UC Medications - No data to display  Initial Impression / Assessment and Plan / UC Course  I have reviewed the triage vital signs and the nursing notes.  Pertinent labs & imaging results that were available during my care of the patient were reviewed by me and considered in my medical decision making (see chart for details).    EKG also reviewed by Dr. Tracie Harrier.  EKG normal sinus rhythm, does have T wave inversions in leads II, III and aVF, these were present previously on EKG and leads III and aVF, new in lead II.  Similar T wave  inversions in V4 through V6, also present on previous EKG. discussed with patient and ability to definitively rule out MI with testing today and advised emergency room would be the definitive rule out.  Discussed possible muscular cause as well given reproducible symptoms with palpation.  Patient opted to monitor symptoms overnight, treat with anti-inflammatories, but stressed importance of going to the emergency room if he has any worsening of his symptoms or starts to notice his symptoms with  exertion.Discussed strict return precautions. Patient verbalized understanding and is agreeable with plan.  Final Clinical Impressions(s) / UC Diagnoses   Final diagnoses:  Chest wall pain     Discharge Instructions     Use anti-inflammatories for pain/swelling. You may take up to 800 mg Ibuprofen every 8 hours with food. You may supplement Ibuprofen with Tylenol [phone] mg every 8 hours.  OR Naprosyn twice daily with food  IF pain worsening or not improving please go to emergency room   ED Prescriptions    Medication Sig Dispense Auth. Provider   naproxen (NAPROSYN) 500 MG tablet Take 1 tablet (500 mg total) by mouth 2 (two) times daily. 30 tablet , [place] C, PA-C     Controlled Substance Prescriptions [place] Controlled Substance Registry consulted? Not Applicable   Lew Dawes, PA-C 03/10/18 1713    Lew Dawes, [place] 03/10/18 1714

## 2018-05-13 ENCOUNTER — Encounter: Payer: Self-pay | Admitting: Family Medicine

## 2018-07-07 IMAGING — DX DG CHEST 2V
2 series · 2 of 2 positions shown · non-contrast
Comparison: 12/16/2015

CLINICAL DATA: Chest pain.

EXAM:
CHEST - 2 VIEW

[chest pa]
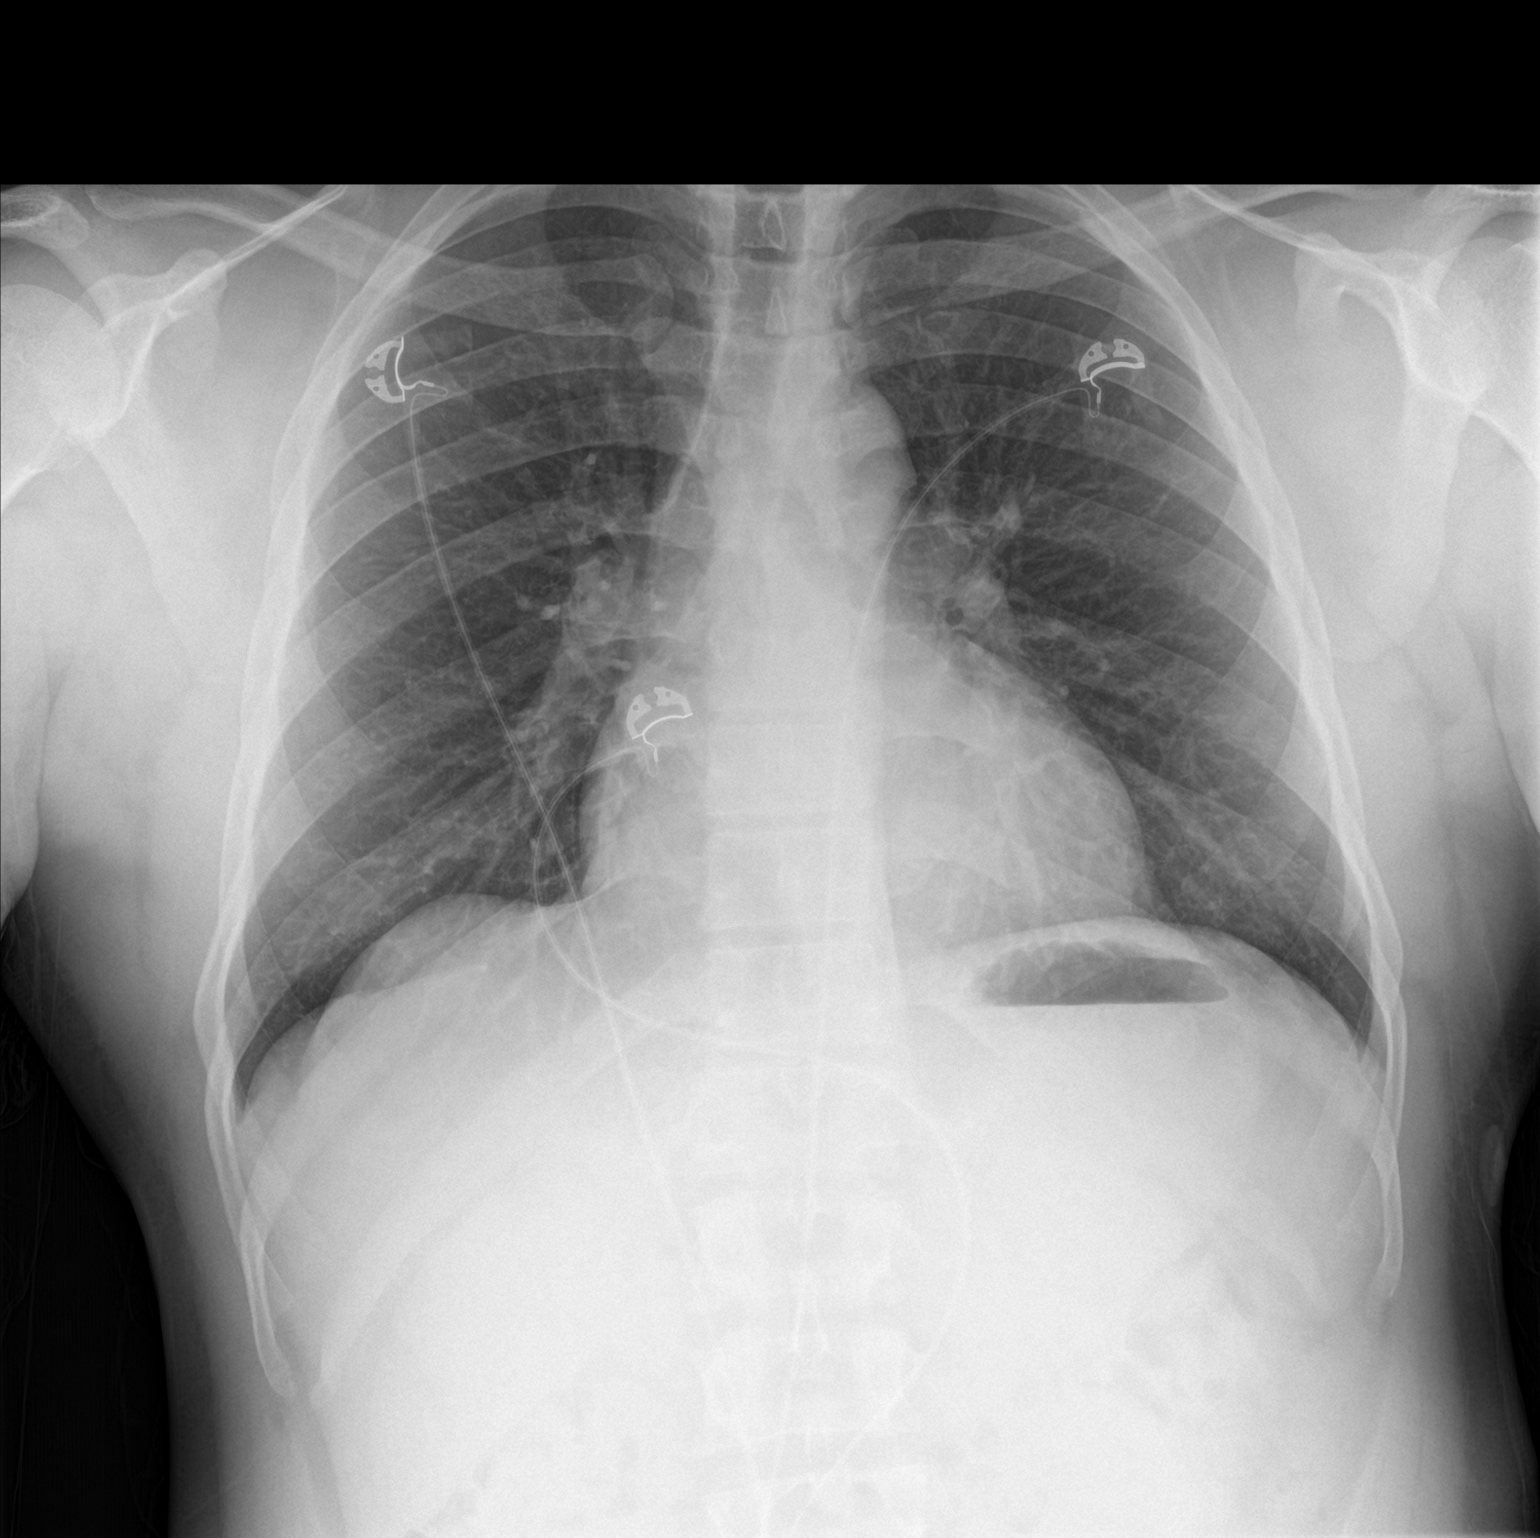

[chest lat]
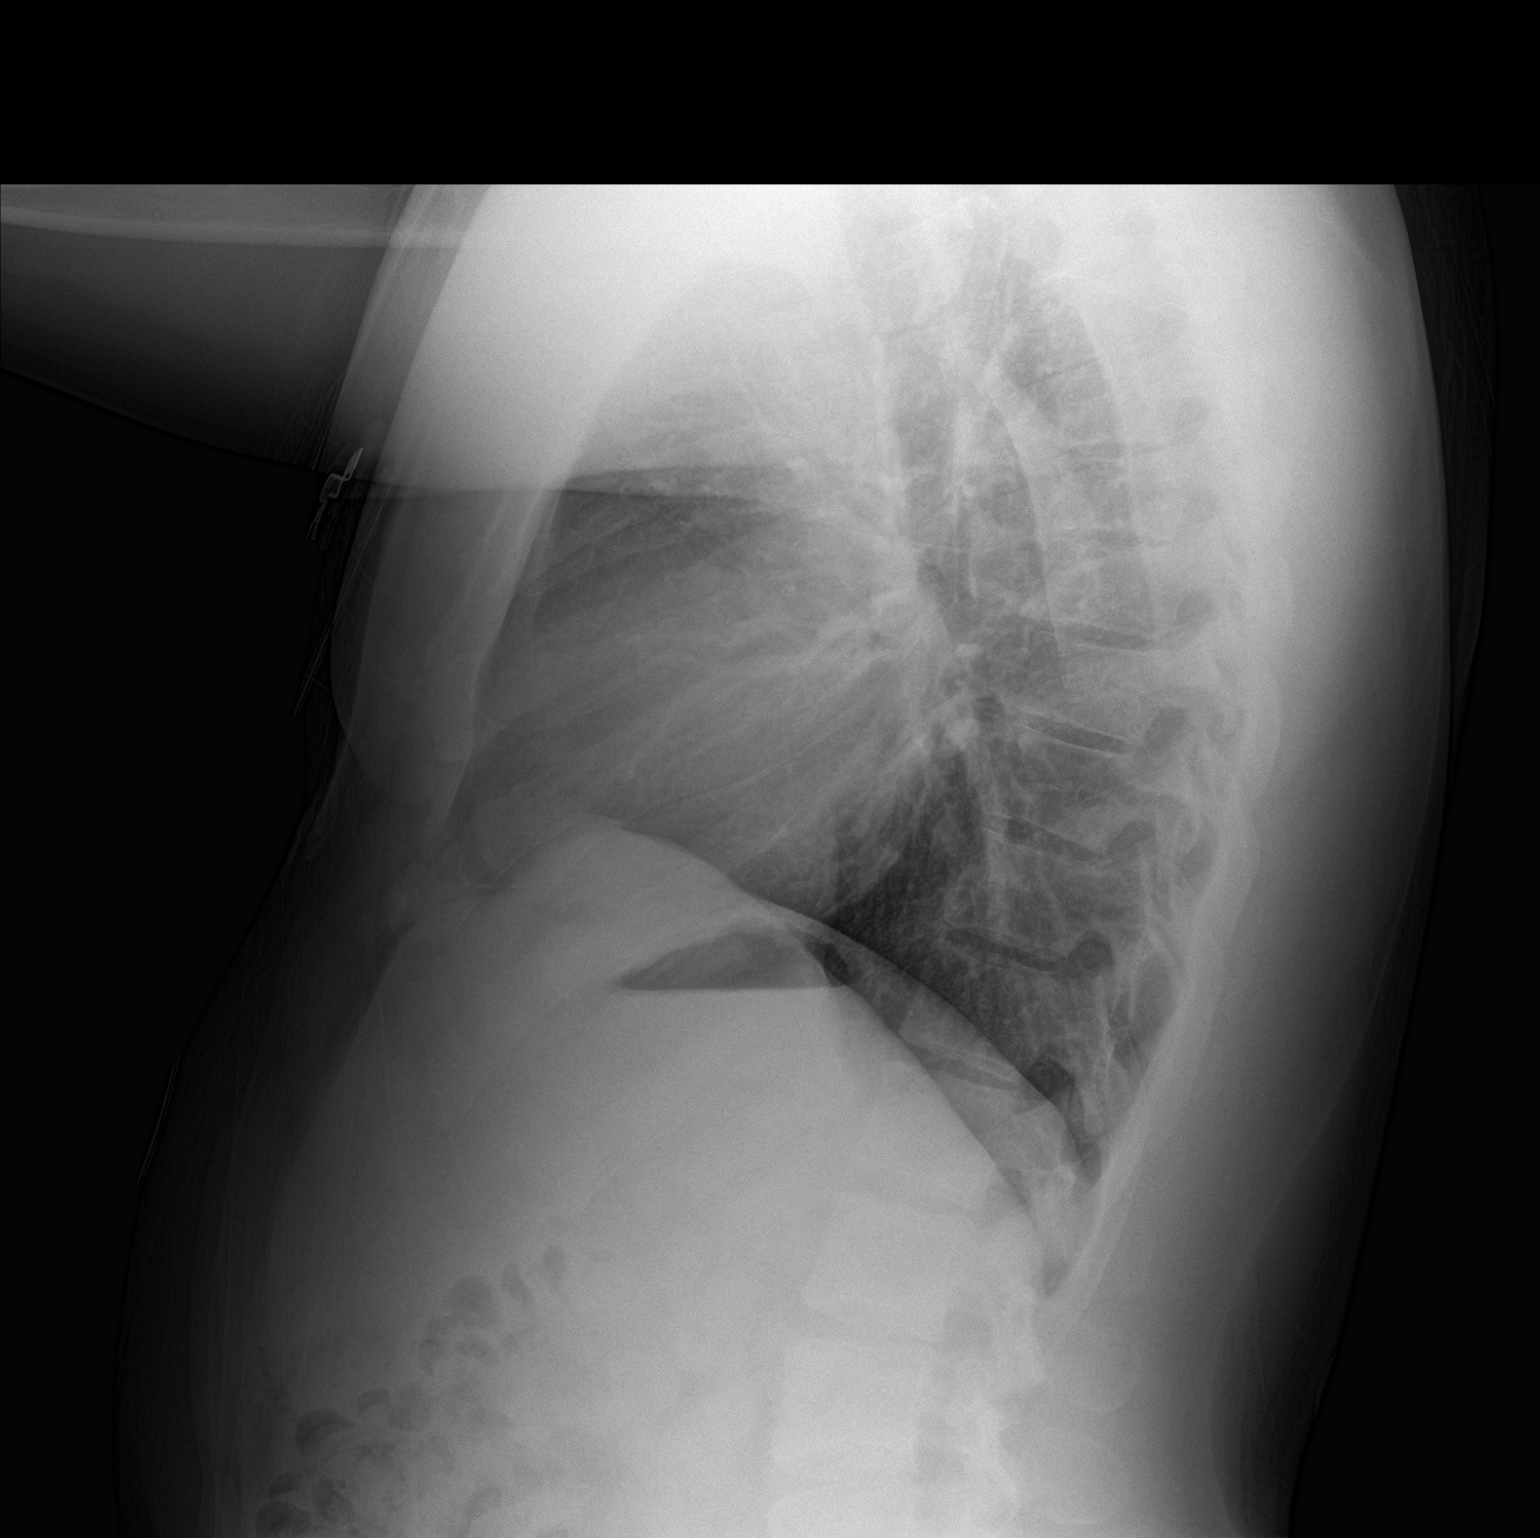

[2 of 2 positions shown; findings below may reference images not displayed]

FINDINGS: The heart size and mediastinal contours are within normal limits.
Both lungs are clear. The visualized skeletal structures are
unremarkable.
IMPRESSION: Normal exam.

## 2018-07-13 ENCOUNTER — Other Ambulatory Visit: Payer: Self-pay | Admitting: Family Medicine
